# Patient Record
Sex: Female | Born: 1980
Health system: Southern US, Community
[De-identification: ages and names within clinical notes are randomized; demographics above are authoritative.]

## PROBLEM LIST (undated history)

## (undated) ENCOUNTER — Inpatient Hospital Stay (HOSPITAL_COMMUNITY): Payer: Self-pay

## (undated) DIAGNOSIS — Z789 Other specified health status: Secondary | ICD-10-CM

## (undated) HISTORY — PX: NO PAST SURGERIES: SHX2092

---

## 2001-07-30 ENCOUNTER — Other Ambulatory Visit: Admission: RE | Admit: 2001-07-30 | Discharge: 2001-07-30 | Payer: Self-pay | Admitting: Gynecology

## 2002-08-10 ENCOUNTER — Other Ambulatory Visit: Admission: RE | Admit: 2002-08-10 | Discharge: 2002-08-10 | Payer: Self-pay | Admitting: Gynecology

## 2003-08-12 ENCOUNTER — Other Ambulatory Visit: Admission: RE | Admit: 2003-08-12 | Discharge: 2003-08-12 | Payer: Self-pay | Admitting: Gynecology

## 2004-09-27 ENCOUNTER — Other Ambulatory Visit: Admission: RE | Admit: 2004-09-27 | Discharge: 2004-09-27 | Payer: Self-pay | Admitting: Gynecology

## 2005-09-30 ENCOUNTER — Other Ambulatory Visit: Admission: RE | Admit: 2005-09-30 | Discharge: 2005-09-30 | Payer: Self-pay | Admitting: Gynecology

## 2006-10-14 ENCOUNTER — Other Ambulatory Visit: Admission: RE | Admit: 2006-10-14 | Discharge: 2006-10-14 | Payer: Self-pay | Admitting: Gynecology

## 2007-12-09 ENCOUNTER — Other Ambulatory Visit: Admission: RE | Admit: 2007-12-09 | Discharge: 2007-12-09 | Payer: Self-pay | Admitting: Gynecology

## 2009-01-04 ENCOUNTER — Other Ambulatory Visit: Admission: RE | Admit: 2009-01-04 | Discharge: 2009-01-04 | Payer: Self-pay | Admitting: Gynecology

## 2009-01-04 ENCOUNTER — Ambulatory Visit: Payer: Self-pay | Admitting: Women's Health

## 2009-01-04 ENCOUNTER — Encounter: Payer: Self-pay | Admitting: Women's Health

## 2014-04-06 ENCOUNTER — Ambulatory Visit: Payer: Self-pay | Admitting: Women's Health

## 2014-05-05 ENCOUNTER — Ambulatory Visit: Payer: Self-pay | Admitting: Women's Health

## 2014-06-15 ENCOUNTER — Ambulatory Visit (INDEPENDENT_AMBULATORY_CARE_PROVIDER_SITE_OTHER): Payer: Self-pay | Admitting: Women's Health

## 2014-06-15 ENCOUNTER — Encounter: Payer: Self-pay | Admitting: Women's Health

## 2014-06-15 VITALS — BP 122/82 | Ht 68.8 in | Wt 262.6 lb

## 2014-06-15 DIAGNOSIS — N926 Irregular menstruation, unspecified: Secondary | ICD-10-CM

## 2014-06-15 DIAGNOSIS — Z01419 Encounter for gynecological examination (general) (routine) without abnormal findings: Secondary | ICD-10-CM

## 2014-06-15 LAB — CBC WITH DIFFERENTIAL/PLATELET
BASOS PCT: 0 % (ref 0–1)
Basophils Absolute: 0 10*3/uL (ref 0.0–0.1)
EOS ABS: 0.1 10*3/uL (ref 0.0–0.7)
Eosinophils Relative: 2 % (ref 0–5)
HCT: 38.7 % (ref 36.0–46.0)
HEMOGLOBIN: 13.6 g/dL (ref 12.0–15.0)
Lymphocytes Relative: 22 % (ref 12–46)
Lymphs Abs: 1.6 10*3/uL (ref 0.7–4.0)
MCH: 32.1 pg (ref 26.0–34.0)
MCHC: 35.1 g/dL (ref 30.0–36.0)
MCV: 91.3 fL (ref 78.0–100.0)
MONOS PCT: 8 % (ref 3–12)
Monocytes Absolute: 0.6 10*3/uL (ref 0.1–1.0)
NEUTROS PCT: 68 % (ref 43–77)
Neutro Abs: 5 10*3/uL (ref 1.7–7.7)
PLATELETS: 235 10*3/uL (ref 150–400)
RBC: 4.24 MIL/uL (ref 3.87–5.11)
RDW: 13.3 % (ref 11.5–15.5)
WBC: 7.3 10*3/uL (ref 4.0–10.5)

## 2014-06-15 LAB — TSH: TSH: 2.292 u[IU]/mL (ref 0.350–4.500)

## 2014-06-15 NOTE — Progress Notes (Signed)
Carol CooleyMegan S Webb [birth year] [id number]    History:    Presents for annual exam.  Monthly cycle for 6-8 days, off birth control pills for greater than one year without conception. [facility] with normal Paps and negative STD screen. Reports husband as healthy.  Past medical history, past surgical history, family history and social history were all reviewed and documented in the EPIC chart. 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  ROS:  A  12 point ROS was performed and pertinent positives and negatives are included.  Exam:  Filed Vitals:   06/15/14 1058  BP: 122/82    General appearance:  Normal Thyroid:  Symmetrical, normal in size, without palpable masses or nodularity. Respiratory  Auscultation:  Clear without wheezing or rhonchi Cardiovascular  Auscultation:  Regular rate, without rubs, murmurs or gallops  Edema/varicosities:  Not grossly evident Abdominal  Soft,nontender, without masses, guarding or rebound.  Liver/spleen:  No organomegaly noted  Hernia:  None appreciated  Skin  Inspection:  Grossly normal   Breasts: Examined lying and sitting.     Right: Without masses, retractions, discharge or axillary adenopathy.     Left: Without masses, retractions, discharge or axillary adenopathy. Gentitourinary   Inguinal/mons:  Normal without inguinal adenopathy  External genitalia:  Normal  BUS/Urethra/Skene's glands:  Normal  Vagina:  3 cm nontender vaginal cyst, present for many years  Cervix:  Normal  Uterus:   normal in size, shape and contour.  Midline and mobile  Adnexa/parametria:     Rt: Without masses or tenderness.   Lt: Without masses or tenderness.  Anus and perineum: Normal  Digital rectal exam: Normal sphincter tone without palpated masses or tenderness  Assessment/Plan:  33 y.o. MWF G0 for annual exam desiring conception.  No contraception x1 year without pregnancy Morbid obesity Asymptomatic left vaginal cyst  Plan:  Reviewed importance of increasing frequency of intercourse, ovulation predictors reviewed, decreasing weight. CBC, TSH, prolactin. Reviewed checking day 3 of next cycle FSH and estradiol and day 22-25 progesterone level. SBE's, increase exercise and decrease calories for weight loss, calcium rich diet, MVI daily encouraged. Aware we no longer deliver, return to office for viability ultrasound with missed cycle. Safe pregnancy behaviors reviewed.   Note: This dictation was prepared with Dragon/digital dictation.  Any transcriptional errors that result are unintentional. Carol ChallengerYOUNG,Carol Webb J [facility]NP, 1:09 PM 06/15/2014

## 2014-06-15 NOTE — Patient Instructions (Signed)
Pregnancy Tests °HOW DO PREGNANCY TESTS WORK? °All pregnancy tests look for a special hormone in the urine or blood that is only present in pregnant women. This hormone, human chorionic gonadotropin (hCG), is also called the pregnancy hormone.  °WHAT IS THE DIFFERENCE BETWEEN A URINE AND A BLOOD PREGNANCY TEST? IS ONE BETTER THAN THE OTHER? °There are two types of pregnancy tests. °· Blood tests. °· Urine tests. °Both tests look for the presence of hCG, the pregnancy hormone. Many women use a urine test or home pregnancy test (HPT) to find out if they are pregnant. HPTs are cheap, easy to use, can be done at home, and are private. When a woman has a positive result on an HPT, she needs to see her caregiver right away. The caregiver can confirm a positive HPT result with another urine test, a blood test, ultrasound, and a pelvic exam.  °There are two types of blood tests you can get from a caregiver.  °· A quantitative blood test (or the beta hCG test). This test measures the exact amount of hCG in the blood. This means it can pick up very small amounts of hCG, making it a very accurate test. °· A qualitative hCG blood test. This test gives a simple yes or no answer to whether you are pregnant. This test is more like a urine test in terms of its accuracy. °Blood tests can pick up hCG earlier in a pregnancy than urine tests can. Blood tests can tell if you are pregnant about 6 to 8 days after you release an egg from an ovary (ovulate). Urine tests can determine pregnancy about 2 weeks after ovulation.  °HOW IS A HOME PREGNANCY TEST DONE?  °There are many types of home pregnancy tests or HPTs that can be bought over-the-counter at drug or discount stores.  °· Some involve collecting your urine in a cup and dipping a stick into the urine or putting some of the urine into a special container with an eyedropper. °· Others are done by placing a stick into your urine stream. °· Tests vary in how long you need to wait for  the stick or container to turn a certain color or have a symbol on it (like a plus or a minus). °· All tests come with written instructions. Most tests also have toll-free phone numbers to call if you have any questions about how to do the test or read the results. °HOW ACCURATE ARE HOME PREGNANCY TESTS?  °HPTs are very accurate. Most brands of HPTs say they are 97% to 99% accurate when taken 1 week after missing your menstrual period, but this can vary with actual use. Each brand varies in how sensitive it is in picking up the pregnancy hormone hCG. If a test is not done correctly, it will be less accurate. Always check the package to make sure it is not past its expiration date. If it is, it will not be accurate. Most brands of HPTs tell users to do the test again in a few days, no matter what the results.  °If you use an HPT too early in your pregnancy, you may not have enough of the pregnancy hormone hCG in your urine to have a positive test result. Most HPTs will be accurate if you test yourself around the time your period is due (about 2 weeks after you ovulate). You can get a negative test result if you are not pregnant or if you ovulated later than you thought you did.   You may also have problems with the pregnancy, which affects the amount of hCG you have in your urine. If your HPT is negative, test yourself again within a few days to 1 week. If you keep getting a negative result and think you are pregnant, talk with your caregiver right away about getting a blood pregnancy test.  °FALSE POSITIVE PREGNANCY TEST °A false positive HPT can happen if there is blood or protein present in your urine. A false positive can also happen if you were recently pregnant or if you take a pregnancy test too soon after taking fertility drug that contains hCG. Also, some prescription medicines such as water pills (diuretics), tranquilizers, seizure medicines, psychiatric medicines, and allergy and nausea medicines  (promethazine) give false positive readings. °FALSE NEGATIVE PREGNANCY TEST °· A false negative HPT can happen if you do the test too early. Try to wait until you are at least 1 day late for your menstrual period. °· It may happen if you wait too long to test the urine (longer than 15 minutes). °· It may also happen if the urine is too diluted because you drank a lot of fluids before getting the urine sample. It is best to test the first morning urine after you get out of bed. °If your menstrual period did not start after a week of a negative HPT, repeat the pregnancy test. °CAN ANYTHING INTERFERE WITH HOME PREGNANCY TEST RESULTS?  °Most medicines, both over-the-counter and prescription drugs, including birth control pills and antibiotics, should not affect the results of a HPT. Only those drugs that have the pregnancy hormone hCG in them can give a false positive test result. Drugs that have hCG in them may be used for treating infertility (not being able to get pregnant). Alcohol and illegal drugs do not affect HPT results, but you should not be using these substances if you are trying to get pregnant. If you have a positive pregnancy test, call your caregiver to make an appointment to begin prenatal care. °Document Released: 10/17/2003 Document Revised: 01/06/2012 Document Reviewed: 01/28/2014 °ExitCare® Patient Information ©2015 [employer]. This information is not intended to replace advice given to you by your health care provider. Make sure you discuss any questions you have with your health care provider. ° °

## 2014-06-16 LAB — PROLACTIN: Prolactin: 13.5 ng/mL

## 2014-06-29 ENCOUNTER — Telehealth: Payer: Self-pay | Admitting: *Deleted

## 2014-06-29 NOTE — Telephone Encounter (Signed)
Pt left message in voicemail asking which day to have blood work done. I left message on pt voicemail per note "day 3 of next cycle FSH and estradiol and day 22-25 progesterone level.

## 2014-07-01 ENCOUNTER — Other Ambulatory Visit: Payer: Self-pay | Admitting: Women's Health

## 2014-07-01 ENCOUNTER — Other Ambulatory Visit: Payer: Self-pay

## 2014-07-01 DIAGNOSIS — N926 Irregular menstruation, unspecified: Secondary | ICD-10-CM

## 2014-07-02 LAB — FOLLICLE STIMULATING HORMONE: FSH: 6.3 m[IU]/mL

## 2014-07-05 LAB — ESTRADIOL, FREE

## 2014-07-06 LAB — ESTRADIOL: Estradiol: 25.5 pg/mL

## 2014-07-25 ENCOUNTER — Other Ambulatory Visit: Payer: Self-pay

## 2014-07-25 DIAGNOSIS — N926 Irregular menstruation, unspecified: Secondary | ICD-10-CM

## 2014-07-26 LAB — PROGESTERONE: PROGESTERONE: 2.2 ng/mL

## 2014-07-29 ENCOUNTER — Telehealth: Payer: Self-pay | Admitting: *Deleted

## 2014-07-29 NOTE — Telephone Encounter (Signed)
Notes faxed to Dr.Yalcinkaya office they will contact pt to schedule. 

## 2014-07-29 NOTE — Telephone Encounter (Signed)
Message copied by Aura CampsWEBB, Bradrick Kamau L on Fri Jul 29, 2014  2:16 PM ------      Message from: Keenan BachelorANNAS, KATHERINE R      Created: Fri Jul 29, 2014  1:01 PM      Regarding: Referral to April MansonYalcinkaya       Per Yolanda BonineNancy Y. "Please call and review progesterone low end, estradiol also low which is probably contributing to why not pregnant, may not be ovulating.  Also should check semen analysis and will need to be referred to fertility specialist, Dr April MansonYalcinkaya. "            I informed patient of above and that you will send referral form to them and they will contact her to schedule and arrange semen analysis.      Thanks! ------

## 2017-03-12 ENCOUNTER — Encounter: Payer: Self-pay | Admitting: Gynecology

## 2017-08-25 DIAGNOSIS — Z3149 Encounter for other procreative investigation and testing: Secondary | ICD-10-CM | POA: Diagnosis not present

## 2017-09-08 DIAGNOSIS — Z3A01 Less than 8 weeks gestation of pregnancy: Secondary | ICD-10-CM | POA: Diagnosis not present

## 2017-09-08 DIAGNOSIS — O0901 Supervision of pregnancy with history of infertility, first trimester: Secondary | ICD-10-CM | POA: Diagnosis not present

## 2017-09-08 DIAGNOSIS — Z3201 Encounter for pregnancy test, result positive: Secondary | ICD-10-CM | POA: Diagnosis not present

## 2017-09-25 DIAGNOSIS — Z23 Encounter for immunization: Secondary | ICD-10-CM | POA: Diagnosis not present

## 2017-09-25 DIAGNOSIS — Z3201 Encounter for pregnancy test, result positive: Secondary | ICD-10-CM | POA: Diagnosis not present

## 2017-10-16 DIAGNOSIS — O09511 Supervision of elderly primigravida, first trimester: Secondary | ICD-10-CM | POA: Diagnosis not present

## 2017-10-16 DIAGNOSIS — Z3689 Encounter for other specified antenatal screening: Secondary | ICD-10-CM | POA: Diagnosis not present

## 2017-10-16 DIAGNOSIS — Z118 Encounter for screening for other infectious and parasitic diseases: Secondary | ICD-10-CM | POA: Diagnosis not present

## 2017-10-16 DIAGNOSIS — O021 Missed abortion: Secondary | ICD-10-CM | POA: Diagnosis not present

## 2017-10-27 DIAGNOSIS — O034 Incomplete spontaneous abortion without complication: Secondary | ICD-10-CM | POA: Diagnosis not present

## 2017-11-04 DIAGNOSIS — O021 Missed abortion: Secondary | ICD-10-CM | POA: Diagnosis not present

## 2018-01-30 DIAGNOSIS — Z1151 Encounter for screening for human papillomavirus (HPV): Secondary | ICD-10-CM | POA: Diagnosis not present

## 2018-01-30 DIAGNOSIS — Z6838 Body mass index (BMI) 38.0-38.9, adult: Secondary | ICD-10-CM | POA: Diagnosis not present

## 2018-01-30 DIAGNOSIS — Z3149 Encounter for other procreative investigation and testing: Secondary | ICD-10-CM | POA: Diagnosis not present

## 2018-01-30 DIAGNOSIS — E669 Obesity, unspecified: Secondary | ICD-10-CM | POA: Diagnosis not present

## 2018-01-30 DIAGNOSIS — N72 Inflammatory disease of cervix uteri: Secondary | ICD-10-CM | POA: Diagnosis not present

## 2018-01-30 DIAGNOSIS — B977 Papillomavirus as the cause of diseases classified elsewhere: Secondary | ICD-10-CM | POA: Diagnosis not present

## 2018-01-30 DIAGNOSIS — Z01419 Encounter for gynecological examination (general) (routine) without abnormal findings: Secondary | ICD-10-CM | POA: Diagnosis not present

## 2018-03-02 DIAGNOSIS — Z6836 Body mass index (BMI) 36.0-36.9, adult: Secondary | ICD-10-CM | POA: Diagnosis not present

## 2018-03-02 DIAGNOSIS — Z713 Dietary counseling and surveillance: Secondary | ICD-10-CM | POA: Diagnosis not present

## 2018-03-25 DIAGNOSIS — O09291 Supervision of pregnancy with other poor reproductive or obstetric history, first trimester: Secondary | ICD-10-CM | POA: Diagnosis not present

## 2018-03-25 DIAGNOSIS — Z3201 Encounter for pregnancy test, result positive: Secondary | ICD-10-CM | POA: Diagnosis not present

## 2018-03-25 DIAGNOSIS — Z3A01 Less than 8 weeks gestation of pregnancy: Secondary | ICD-10-CM | POA: Diagnosis not present

## 2018-03-27 DIAGNOSIS — O09291 Supervision of pregnancy with other poor reproductive or obstetric history, first trimester: Secondary | ICD-10-CM | POA: Diagnosis not present

## 2018-03-27 DIAGNOSIS — Z3A01 Less than 8 weeks gestation of pregnancy: Secondary | ICD-10-CM | POA: Diagnosis not present

## 2018-03-29 ENCOUNTER — Encounter (HOSPITAL_COMMUNITY): Payer: Self-pay | Admitting: *Deleted

## 2018-03-29 ENCOUNTER — Emergency Department (HOSPITAL_COMMUNITY): Payer: BLUE CROSS/BLUE SHIELD

## 2018-03-29 ENCOUNTER — Other Ambulatory Visit: Payer: Self-pay

## 2018-03-29 ENCOUNTER — Observation Stay (HOSPITAL_COMMUNITY)
Admission: EM | Admit: 2018-03-29 | Discharge: 2018-03-31 | DRG: 742 | Disposition: A | Discharge status: Home / Self Care | Payer: BLUE CROSS/BLUE SHIELD | Attending: Obstetrics and Gynecology | Admitting: Obstetrics and Gynecology

## 2018-03-29 DIAGNOSIS — N83201 Unspecified ovarian cyst, right side: Secondary | ICD-10-CM | POA: Diagnosis not present

## 2018-03-29 DIAGNOSIS — Z3A01 Less than 8 weeks gestation of pregnancy: Secondary | ICD-10-CM | POA: Diagnosis not present

## 2018-03-29 DIAGNOSIS — R188 Other ascites: Secondary | ICD-10-CM | POA: Diagnosis not present

## 2018-03-29 DIAGNOSIS — R109 Unspecified abdominal pain: Secondary | ICD-10-CM | POA: Diagnosis not present

## 2018-03-29 DIAGNOSIS — K661 Hemoperitoneum: Secondary | ICD-10-CM | POA: Insufficient documentation

## 2018-03-29 DIAGNOSIS — D649 Anemia, unspecified: Secondary | ICD-10-CM | POA: Insufficient documentation

## 2018-03-29 DIAGNOSIS — O3480 Maternal care for other abnormalities of pelvic organs, unspecified trimester: Secondary | ICD-10-CM | POA: Diagnosis not present

## 2018-03-29 DIAGNOSIS — Z87891 Personal history of nicotine dependence: Secondary | ICD-10-CM | POA: Diagnosis not present

## 2018-03-29 DIAGNOSIS — O26891 Other specified pregnancy related conditions, first trimester: Secondary | ICD-10-CM

## 2018-03-29 DIAGNOSIS — R1010 Upper abdominal pain, unspecified: Secondary | ICD-10-CM | POA: Diagnosis not present

## 2018-03-29 DIAGNOSIS — O26899 Other specified pregnancy related conditions, unspecified trimester: Secondary | ICD-10-CM | POA: Diagnosis not present

## 2018-03-29 DIAGNOSIS — O009 Unspecified ectopic pregnancy without intrauterine pregnancy: Secondary | ICD-10-CM

## 2018-03-29 DIAGNOSIS — Z3A00 Weeks of gestation of pregnancy not specified: Secondary | ICD-10-CM | POA: Diagnosis not present

## 2018-03-29 DIAGNOSIS — O283 Abnormal ultrasonic finding on antenatal screening of mother: Secondary | ICD-10-CM | POA: Diagnosis not present

## 2018-03-29 DIAGNOSIS — Z6839 Body mass index (BMI) 39.0-39.9, adult: Secondary | ICD-10-CM | POA: Diagnosis not present

## 2018-03-29 DIAGNOSIS — R1031 Right lower quadrant pain: Secondary | ICD-10-CM

## 2018-03-29 DIAGNOSIS — R102 Pelvic and perineal pain: Secondary | ICD-10-CM | POA: Diagnosis not present

## 2018-03-29 DIAGNOSIS — O3680X0 Pregnancy with inconclusive fetal viability, not applicable or unspecified: Secondary | ICD-10-CM

## 2018-03-29 DIAGNOSIS — Z6835 Body mass index (BMI) 35.0-35.9, adult: Secondary | ICD-10-CM | POA: Diagnosis not present

## 2018-03-29 DIAGNOSIS — R1030 Lower abdominal pain, unspecified: Secondary | ICD-10-CM | POA: Diagnosis not present

## 2018-03-29 HISTORY — DX: Other specified health status: Z78.9

## 2018-03-29 LAB — CBC
HCT: 37 % (ref 36.0–46.0)
Hemoglobin: 12.3 g/dL (ref 12.0–15.0)
MCH: 31.8 pg (ref 26.0–34.0)
MCHC: 33.2 g/dL (ref 30.0–36.0)
MCV: 95.6 fL (ref 78.0–100.0)
PLATELETS: 324 10*3/uL (ref 150–400)
RBC: 3.87 MIL/uL (ref 3.87–5.11)
RDW: 13.1 % (ref 11.5–15.5)
WBC: 20.9 10*3/uL — AB (ref 4.0–10.5)

## 2018-03-29 LAB — WET PREP, GENITAL
Sperm: NONE SEEN
Trich, Wet Prep: NONE SEEN
Yeast Wet Prep HPF POC: NONE SEEN

## 2018-03-29 LAB — SAMPLE TO BLOOD BANK

## 2018-03-29 LAB — COMPREHENSIVE METABOLIC PANEL
ALT: 19 U/L (ref 14–54)
AST: 21 U/L (ref 15–41)
Albumin: 4.1 g/dL (ref 3.5–5.0)
Alkaline Phosphatase: 49 U/L (ref 38–126)
Anion gap: 12 (ref 5–15)
BILIRUBIN TOTAL: 1.2 mg/dL (ref 0.3–1.2)
BUN: 9 mg/dL (ref 6–20)
CHLORIDE: 100 mmol/L — AB (ref 101–111)
CO2: 18 mmol/L — ABNORMAL LOW (ref 22–32)
CREATININE: 0.68 mg/dL (ref 0.44–1.00)
Calcium: 9 mg/dL (ref 8.9–10.3)
GFR calc non Af Amer: >60 mL/min (ref >60)
Glucose, Bld: 155 mg/dL — ABNORMAL HIGH (ref 65–99)
Potassium: 3.3 mmol/L — ABNORMAL LOW (ref 3.5–5.1)
Sodium: 130 mmol/L — ABNORMAL LOW (ref 135–145)
TOTAL PROTEIN: 7.1 g/dL (ref 6.5–8.1)

## 2018-03-29 LAB — I-STAT CHEM 8, ED
BUN: 10 mg/dL (ref 6–20)
CHLORIDE: 100 mmol/L — AB (ref 101–111)
CREATININE: 0.5 mg/dL (ref 0.44–1.00)
Calcium, Ion: 1.18 mmol/L (ref 1.15–1.40)
Glucose, Bld: 126 mg/dL — ABNORMAL HIGH (ref 65–99)
HEMATOCRIT: 30 % — AB (ref 36.0–46.0)
Hemoglobin: 10.2 g/dL — ABNORMAL LOW (ref 12.0–15.0)
POTASSIUM: 4 mmol/L (ref 3.5–5.1)
SODIUM: 135 mmol/L (ref 135–145)
TCO2: 20 mmol/L — ABNORMAL LOW (ref 22–32)

## 2018-03-29 LAB — I-STAT BETA HCG BLOOD, ED (MC, WL, AP ONLY): HCG, QUANTITATIVE: 1662.4 m[IU]/mL — AB (ref <5)

## 2018-03-29 LAB — LIPASE, BLOOD: LIPASE: 25 U/L (ref 11–51)

## 2018-03-29 MED ORDER — SODIUM CHLORIDE 0.9 % IV BOLUS
1000.0000 mL | Freq: Once | INTRAVENOUS | Status: AC
  Administered 2018-03-29: 1000 mL via INTRAVENOUS

## 2018-03-29 NOTE — ED Provider Notes (Signed)
[facility] EMERGENCY DEPARTMENT Provider Note   CSN: [id number] Arrival date & time: 03/29/18  1825     History   Chief Complaint Chief Complaint  Patient presents with  . rectal pain    HPI Carol Webb is a 37 y.o. female.  HPI Carol Webb is a 37 y.o. female Presents to ED with complaint of rectal pain, abdominal pain, nausea, vomiting, dizziness. Pt's symptoms began... Patient states she is about [redacted] weeks pregnant.  This is her second pregnancy, states the first 1 resulted in miscarriage in December.  She was seen by OB/GYN last week, states was there for hCG check, she came back 2 days later for recheck and states her levels were elevated normally at that time.  Patient did not have ultrasound.  She states that currently having pain in the upper abdomen, mainly right upper quadrant radiating to the shoulders.  She reports associated nausea.  She also reports pressure in the rectum.  States she may be constipated.  She is unsure when her last normal bowel movement was, but states she had a small bowel movement here in the waiting room.  She denies any rectal pain.  There is no rectal bleeding.  No vomiting.  No fever or chills.  No urinary symptoms.  Denies any vaginal discharge, denies any vaginal bleeding.  History reviewed. No pertinent past medical history.  There are no active problems to display for this patient.   History reviewed. No pertinent surgical history.   OB History    Gravida  1   Para      Term      Preterm      AB      Living        SAB      TAB      Ectopic      Multiple      Live Births               Home Medications    Prior to Admission medications   Not on File    Family History Family History  Problem Relation Age of Onset  . Colon cancer Father   . Prostate cancer Father     Social History Social History   Tobacco Use  . Smoking status: Former Smoker    Types: Cigarettes  . Smokeless  tobacco: Never Used  Substance Use Topics  . Alcohol use: Yes  . Drug use: Yes    Types: Marijuana     Allergies   Patient has no known allergies.   Review of Systems Review of Systems  Constitutional: Negative for chills and fever.  Respiratory: Negative for cough, chest tightness and shortness of breath.   Cardiovascular: Negative for chest pain, palpitations and leg swelling.  Gastrointestinal: Positive for abdominal pain and nausea. Negative for blood in stool, diarrhea, rectal pain and vomiting.  Genitourinary: Negative for dysuria, flank pain, pelvic pain, vaginal bleeding, vaginal discharge and vaginal pain.  Musculoskeletal: Negative for arthralgias, myalgias, neck pain and neck stiffness.  Skin: Negative for rash.  Neurological: Positive for dizziness and light-headedness. Negative for weakness and headaches.  All other systems reviewed and are negative.    Physical Exam Updated Vital Signs BP 119/87 (BP Location: Right Arm)   Pulse (!) 114   Temp 97.6 F (36.4 C) (Oral)   Resp 17   Ht 5\' 7"  (1.702 m)   Wt 103 kg (227 lb)   LMP 02/20/2018  SpO2 100%   BMI 35.55 kg/m   Physical Exam  Constitutional: She appears well-developed and well-nourished. No distress.  HENT:  Head: Normocephalic.  Eyes: Conjunctivae are normal.  Neck: Neck supple.  Cardiovascular: Normal rate, regular rhythm and normal heart sounds.  Pulmonary/Chest: Effort normal and breath sounds normal. No respiratory distress. She has no wheezes. She has no rales.  Abdominal: Soft. Bowel sounds are normal. She exhibits no distension. There is tenderness. There is no rebound.  Right upper quadrant tenderness  Genitourinary:  Genitourinary Comments: Rectum is normal, no hemorrhoids, no fissures.  Digital exam unremarkable, no stool, no pain or tenderness.  Stool is brown.  External vaginal genitalia normal.  Small white thin vaginal discharge.  There is a cystic structure to the left wall of the  vaginal canal (chronic), there is no cervical motion tenderness, no uterine tenderness.  There is no right adnexal tenderness.  There is minimal left adnexal tenderness.  Cervix is normal, closed.  Musculoskeletal: She exhibits no edema.  Neurological: She is alert.  Skin: Skin is warm and dry.  Psychiatric: She has a normal mood and affect. Her behavior is normal.  Nursing note and vitals reviewed.    ED Treatments / Results  Labs (all labs ordered are listed, but only abnormal results are displayed) Labs Reviewed  WET PREP, GENITAL - Abnormal; Notable for the following components:      Result Value   Clue Cells Wet Prep HPF POC PRESENT (*)    WBC, Wet Prep HPF POC MANY (*)    All other components within normal limits  COMPREHENSIVE METABOLIC PANEL - Abnormal; Notable for the following components:   Sodium 130 (*)    Potassium 3.3 (*)    Chloride 100 (*)    CO2 18 (*)    Glucose, Bld 155 (*)    All other components within normal limits  CBC - Abnormal; Notable for the following components:   WBC 20.9 (*)    All other components within normal limits  I-STAT BETA HCG BLOOD, ED ([facility], [facility], [facility] ONLY) - Abnormal; Notable for the following components:   I-stat hCG, quantitative 1,662.4 (*)    All other components within normal limits  LIPASE, BLOOD  URINALYSIS, ROUTINE W REFLEX MICROSCOPIC  SAMPLE TO BLOOD BANK  GC/CHLAMYDIA PROBE AMP ([place]) NOT AT [facility]    EKG None  Radiology No results found.  Procedures Procedures (including critical care time)  Medications Ordered in ED Medications - No data to display   Initial Impression / Assessment and Plan / ED Course  I have reviewed the triage vital signs and the nursing notes.  Pertinent labs & imaging results that were available during my care of the patient were reviewed by me and considered in my medical decision making (see chart for details).     Patient approximately [redacted] weeks pregnant, presents with rectal  pressure, abdominal pain, nausea, dizziness.  Rectal exam is unremarkable, no stool in rectum.  Patient does have right upper quadrant abdominal pain.  No significant suprapubic tenderness or adnexal tenderness.  Will check labs, will get ultrasound for further evaluation  9:56 PM Patient does not want anything for pain.  She continues to experience severe pains in the upper abdomen that come and go and radiate around upper abdomen and into the bilateral shoulders.  She is not having any cough or shortness of breath with the symptoms.  Her oxygen saturations 100%.  I suspect this could be related to her  gallbladder disease.  Ultrasound is pending.  Ultrasound pelvis pending as well.  Signed out at shift change pending results.  Final Clinical Impressions(s) / ED Diagnoses   Final diagnoses:  Abdominal pain    ED Discharge Orders    None       Iona CoachKirichenko, Letetia Romanello, PA-C 03/29/18 2156    Gwyneth SproutPlunkett, Whitney, MD 03/29/18 2158

## 2018-03-29 NOTE — ED Notes (Signed)
Patient transported to Ultrasound 

## 2018-03-29 NOTE — ED Notes (Signed)
Prior RN unable to gain IV access, this RN attempted, unsuccessful.  PA advised and instructed to not try anymore, might not need it. Hold on bolus.

## 2018-03-29 NOTE — ED Notes (Signed)
The pt had a miscarriage in December  She talked to her ob-gyn on her phone in triage

## 2018-03-29 NOTE — ED Provider Notes (Signed)
Patient was being evaluated for possible gallbladder pathology and had ultrasound performed which showed free fluid in the abdomen. Patient is [redacted] weeks pregnant. Ultrasound was ordered by physician assistan tfor obstetric details concerning for ectopic pregnancy. At this point we are concern for ruptured ectopic, type and screen obtained, difficult IV placed ultrasound-guided by myself. Repeat hemoglobin ordered initial hemoglobin normal range. Patient having persistent pain. Discussed plan for OR, discussed with obstetric specialist and if hemoglobin stable plan to transfer if dropping significantly [facility]. Patient's heart rate 110, blood pressure stable, hemoglobin dropped 2 points over 4 hours.Patient transferred emergently to women's.   Labs Reviewed  WET PREP, GENITAL - Abnormal; Notable for the following components:      Result Value   Clue Cells Wet Prep HPF POC PRESENT (*)    WBC, Wet Prep HPF POC MANY (*)    All other components within normal limits  COMPREHENSIVE METABOLIC PANEL - Abnormal; Notable for the following components:   Sodium 130 (*)    Potassium 3.3 (*)    Chloride 100 (*)    CO2 18 (*)    Glucose, Bld 155 (*)    All other components within normal limits  CBC - Abnormal; Notable for the following components:   WBC 20.9 (*)    All other components within normal limits  I-STAT BETA HCG BLOOD, ED ([facility], [facility], [facility] ONLY) - Abnormal; Notable for the following components:   I-stat hCG, quantitative 1,662.4 (*)    All other components within normal limits  I-STAT CHEM 8, ED - Abnormal; Notable for the following components:   Chloride 100 (*)    Glucose, Bld 126 (*)    TCO2 20 (*)    Hemoglobin 10.2 (*)    HCT 30.0 (*)    All other components within normal limits  LIPASE, BLOOD  URINALYSIS, ROUTINE W REFLEX MICROSCOPIC  CBC  SAMPLE TO BLOOD BANK  TYPE AND SCREEN  ABO/RH  GC/CHLAMYDIA PROBE AMP ([place]) NOT AT [facility]    CRITICAL  CARE Performed by: Carol Webb  Total critical care time: 40 minutes  Critical care time was exclusive of separately billable procedures and treating other patients.  Critical care was necessary to treat or prevent imminent or life-threatening deterioration.  Critical care was time spent personally by me on the following activities: development of treatment plan with patient and/or surrogate as well as nursing, discussions with consultants, evaluation of patient's response to treatment, examination of patient, obtaining history from patient or surrogate, ordering and performing treatments and interventions, ordering and review of laboratory studies, ordering and review of radiographic studies, pulse oximetry and re-evaluation of patient's condition.  Emergency Ultrasound Study:   Angiocath insertion Performed by: Carol Webb  Consent: Verbal consent obtained. Risks and benefits: risks, benefits and alternatives were discussed Immediately prior to procedure the correct patient, procedure, equipment, support staff and site/side marked as needed.  Indication: difficult IV access Preparation: Patient was prepped and draped in the usual sterile fashion. Vein Location: right basilic vein was visualized during assessment for potential access sites and was found to be patent/ easily compressed with linear ultrasound.  The needle was visualized with real-time ultrasound and guided into the vein. Gauge: 18 g  Image saved and stored.  Normal blood return.  Patient tolerance: Patient tolerated the procedure well with no immediate complications.       Carol Webb, Carol Cazier, MD 03/29/18 [phone]

## 2018-03-29 NOTE — ED Notes (Signed)
carelink at bedside 

## 2018-03-29 NOTE — ED Triage Notes (Signed)
Pt c/o rectal pressure and pain  All day  Last bm yesterday that was normal.   Nausea  lmp  April 26th

## 2018-03-30 ENCOUNTER — Inpatient Hospital Stay (HOSPITAL_COMMUNITY): Payer: BLUE CROSS/BLUE SHIELD | Admitting: Certified Registered Nurse Anesthetist

## 2018-03-30 ENCOUNTER — Other Ambulatory Visit: Payer: Self-pay

## 2018-03-30 ENCOUNTER — Encounter (HOSPITAL_COMMUNITY)
Admission: EM | Disposition: A | Discharge status: Home / Self Care | Payer: Self-pay | Source: Home / Self Care | Attending: Emergency Medicine

## 2018-03-30 ENCOUNTER — Encounter (HOSPITAL_COMMUNITY): Payer: Self-pay | Admitting: *Deleted

## 2018-03-30 ENCOUNTER — Inpatient Hospital Stay (HOSPITAL_COMMUNITY): Payer: BLUE CROSS/BLUE SHIELD

## 2018-03-30 DIAGNOSIS — R1030 Lower abdominal pain, unspecified: Secondary | ICD-10-CM | POA: Diagnosis not present

## 2018-03-30 DIAGNOSIS — Z3A00 Weeks of gestation of pregnancy not specified: Secondary | ICD-10-CM | POA: Diagnosis not present

## 2018-03-30 DIAGNOSIS — K659 Peritonitis, unspecified: Secondary | ICD-10-CM | POA: Diagnosis not present

## 2018-03-30 DIAGNOSIS — O283 Abnormal ultrasonic finding on antenatal screening of mother: Secondary | ICD-10-CM | POA: Diagnosis not present

## 2018-03-30 DIAGNOSIS — N83291 Other ovarian cyst, right side: Secondary | ICD-10-CM | POA: Diagnosis not present

## 2018-03-30 DIAGNOSIS — N838 Other noninflammatory disorders of ovary, fallopian tube and broad ligament: Secondary | ICD-10-CM | POA: Diagnosis not present

## 2018-03-30 DIAGNOSIS — O26891 Other specified pregnancy related conditions, first trimester: Secondary | ICD-10-CM | POA: Diagnosis present

## 2018-03-30 DIAGNOSIS — O3480 Maternal care for other abnormalities of pelvic organs, unspecified trimester: Secondary | ICD-10-CM | POA: Diagnosis not present

## 2018-03-30 DIAGNOSIS — N83201 Unspecified ovarian cyst, right side: Secondary | ICD-10-CM | POA: Diagnosis not present

## 2018-03-30 DIAGNOSIS — K661 Hemoperitoneum: Secondary | ICD-10-CM | POA: Diagnosis present

## 2018-03-30 DIAGNOSIS — R109 Unspecified abdominal pain: Secondary | ICD-10-CM

## 2018-03-30 HISTORY — PX: DIAGNOSTIC LAPAROSCOPY WITH REMOVAL OF ECTOPIC PREGNANCY: SHX6449

## 2018-03-30 LAB — POCT I-STAT EG7
ACID-BASE DEFICIT: 3 mmol/L — AB (ref 0.0–2.0)
Bicarbonate: 22.8 mmol/L (ref 20.0–28.0)
CALCIUM ION: 1.19 mmol/L (ref 1.15–1.40)
HEMATOCRIT: 26 % — AB (ref 36.0–46.0)
HEMOGLOBIN: 8.8 g/dL — AB (ref 12.0–15.0)
O2 SAT: 69 %
PH VEN: 7.308 (ref 7.250–7.430)
POTASSIUM: 3.8 mmol/L (ref 3.5–5.1)
Sodium: 138 mmol/L (ref 135–145)
TCO2: 24 mmol/L (ref 22–32)
pCO2, Ven: 45.5 mmHg (ref 44.0–60.0)
pO2, Ven: 40 mmHg (ref 32.0–45.0)

## 2018-03-30 LAB — CBC
HCT: 25.2 % — ABNORMAL LOW (ref 36.0–46.0)
HEMATOCRIT: 26.5 % — AB (ref 36.0–46.0)
HEMOGLOBIN: 8.9 g/dL — AB (ref 12.0–15.0)
Hemoglobin: 8.8 g/dL — ABNORMAL LOW (ref 12.0–15.0)
MCH: 32.8 pg (ref 26.0–34.0)
MCH: 32.1 pg (ref 26.0–34.0)
MCHC: 34.9 g/dL (ref 30.0–36.0)
MCHC: 33.6 g/dL (ref 30.0–36.0)
MCV: 94 fL (ref 78.0–100.0)
MCV: 95.7 fL (ref 78.0–100.0)
Platelets: 208 10*3/uL (ref 150–400)
Platelets: 237 10*3/uL (ref 150–400)
RBC: 2.68 MIL/uL — ABNORMAL LOW (ref 3.87–5.11)
RBC: 2.77 MIL/uL — AB (ref 3.87–5.11)
RDW: 13.5 % (ref 11.5–15.5)
RDW: 13.5 % (ref 11.5–15.5)
WBC: 11 10*3/uL — ABNORMAL HIGH (ref 4.0–10.5)
WBC: 12.2 10*3/uL — ABNORMAL HIGH (ref 4.0–10.5)

## 2018-03-30 LAB — GC/CHLAMYDIA PROBE AMP (~~LOC~~) NOT AT ARMC
CHLAMYDIA, DNA PROBE: NEGATIVE
NEISSERIA GONORRHEA: NEGATIVE

## 2018-03-30 LAB — PREPARE RBC (CROSSMATCH)

## 2018-03-30 LAB — ABO/RH: ABO/RH(D): O POS

## 2018-03-30 LAB — HCG, QUANTITATIVE, PREGNANCY: hCG, Beta Chain, Quant, S: 1702 m[IU]/mL — ABNORMAL HIGH (ref <5)

## 2018-03-30 SURGERY — LAPAROSCOPY, WITH ECTOPIC PREGNANCY SURGICAL TREATMENT
Anesthesia: General

## 2018-03-30 MED ORDER — LIDOCAINE HCL (CARDIAC) PF 100 MG/5ML IV SOSY
PREFILLED_SYRINGE | INTRAVENOUS | Status: DC | PRN
  Administered 2018-03-30: 100 mg via INTRAVENOUS

## 2018-03-30 MED ORDER — SCOPOLAMINE 1 MG/3DAYS TD PT72
MEDICATED_PATCH | TRANSDERMAL | Status: DC | PRN
  Administered 2018-03-30: 1 via TRANSDERMAL

## 2018-03-30 MED ORDER — HYDROMORPHONE HCL 1 MG/ML IJ SOLN
1.0000 mg | Freq: Once | INTRAMUSCULAR | Status: AC
  Administered 2018-03-30: 1 mg via INTRAVENOUS
  Filled 2018-03-30: qty 1

## 2018-03-30 MED ORDER — DEXAMETHASONE SODIUM PHOSPHATE 10 MG/ML IJ SOLN
INTRAMUSCULAR | Status: DC | PRN
  Administered 2018-03-30: 10 mg via INTRAVENOUS

## 2018-03-30 MED ORDER — LIDOCAINE HCL (CARDIAC) PF 100 MG/5ML IV SOSY
PREFILLED_SYRINGE | INTRAVENOUS | Status: AC
  Filled 2018-03-30: qty 5

## 2018-03-30 MED ORDER — MIDAZOLAM HCL 2 MG/2ML IJ SOLN
INTRAMUSCULAR | Status: AC
  Filled 2018-03-30: qty 2

## 2018-03-30 MED ORDER — ACETAMINOPHEN 325 MG PO TABS: 650.0000 mg | ORAL_TABLET | ORAL | Status: DC | PRN

## 2018-03-30 MED ORDER — FENTANYL CITRATE (PF) 100 MCG/2ML IJ SOLN: 100.0000 ug | INTRAMUSCULAR | Status: DC | PRN

## 2018-03-30 MED ORDER — OXYCODONE-ACETAMINOPHEN 5-325 MG PO TABS
1.0000 | ORAL_TABLET | ORAL | Status: DC | PRN
  Administered 2018-03-30: 1 via ORAL
  Filled 2018-03-30: qty 1

## 2018-03-30 MED ORDER — PROMETHAZINE HCL 25 MG/ML IJ SOLN
12.5000 mg | Freq: Once | INTRAMUSCULAR | Status: AC
  Administered 2018-03-30: 12.5 mg via INTRAVENOUS
  Filled 2018-03-30: qty 1

## 2018-03-30 MED ORDER — SODIUM CHLORIDE 0.9 % IJ SOLN
INTRAMUSCULAR | Status: DC | PRN
  Administered 2018-03-30: 10 mL via INTRAVENOUS

## 2018-03-30 MED ORDER — SUCCINYLCHOLINE CHLORIDE 20 MG/ML IJ SOLN
INTRAMUSCULAR | Status: DC | PRN
  Administered 2018-03-30: 100 mg via INTRAVENOUS

## 2018-03-30 MED ORDER — DOCUSATE SODIUM 100 MG PO CAPS: 100.0000 mg | ORAL_CAPSULE | Freq: Every day | ORAL | Status: DC

## 2018-03-30 MED ORDER — MIDAZOLAM HCL 2 MG/2ML IJ SOLN
INTRAMUSCULAR | Status: DC | PRN
  Administered 2018-03-30: 2 mg via INTRAVENOUS

## 2018-03-30 MED ORDER — GLYCOPYRROLATE 0.2 MG/ML IJ SOLN
INTRAMUSCULAR | Status: AC
  Filled 2018-03-30: qty 1

## 2018-03-30 MED ORDER — FENTANYL CITRATE (PF) 100 MCG/2ML IJ SOLN: 25.0000 ug | INTRAMUSCULAR | Status: DC | PRN

## 2018-03-30 MED ORDER — ONDANSETRON HCL 4 MG/2ML IJ SOLN
INTRAMUSCULAR | Status: AC
  Filled 2018-03-30: qty 2

## 2018-03-30 MED ORDER — BUPIVACAINE HCL (PF) 0.25 % IJ SOLN
INTRAMUSCULAR | Status: DC | PRN
  Administered 2018-03-30: 30 mL

## 2018-03-30 MED ORDER — CEFAZOLIN SODIUM-DEXTROSE 2-3 GM-%(50ML) IV SOLR
INTRAVENOUS | Status: DC | PRN
  Administered 2018-03-30: 2 g via INTRAVENOUS

## 2018-03-30 MED ORDER — PROMETHAZINE HCL 25 MG/ML IJ SOLN: 6.2500 mg | INTRAMUSCULAR | Status: DC | PRN

## 2018-03-30 MED ORDER — SUGAMMADEX SODIUM 500 MG/5ML IV SOLN
INTRAVENOUS | Status: AC
  Filled 2018-03-30: qty 5

## 2018-03-30 MED ORDER — DEXTROSE IN LACTATED RINGERS 5 % IV SOLN
INTRAVENOUS | Status: DC
  Administered 2018-03-30: 14:00:00 via INTRAVENOUS

## 2018-03-30 MED ORDER — ROCURONIUM BROMIDE 100 MG/10ML IV SOLN
INTRAVENOUS | Status: AC
  Filled 2018-03-30: qty 1

## 2018-03-30 MED ORDER — CALCIUM CARBONATE ANTACID 500 MG PO CHEW: 2.0000 | CHEWABLE_TABLET | ORAL | Status: DC | PRN

## 2018-03-30 MED ORDER — PROPOFOL 10 MG/ML IV BOLUS
INTRAVENOUS | Status: DC | PRN
  Administered 2018-03-30: 20 mg via INTRAVENOUS
  Administered 2018-03-30: 180 mg via INTRAVENOUS

## 2018-03-30 MED ORDER — FENTANYL CITRATE (PF) 250 MCG/5ML IJ SOLN
INTRAMUSCULAR | Status: AC
  Filled 2018-03-30: qty 5

## 2018-03-30 MED ORDER — FENTANYL CITRATE (PF) 100 MCG/2ML IJ SOLN
INTRAMUSCULAR | Status: DC | PRN
  Administered 2018-03-30: 50 ug via INTRAVENOUS
  Administered 2018-03-30: 100 ug via INTRAVENOUS
  Administered 2018-03-30 (×2): 50 ug via INTRAVENOUS

## 2018-03-30 MED ORDER — ONDANSETRON HCL 4 MG/2ML IJ SOLN
INTRAMUSCULAR | Status: DC | PRN
  Administered 2018-03-30: 4 mg via INTRAVENOUS

## 2018-03-30 MED ORDER — ROCURONIUM BROMIDE 100 MG/10ML IV SOLN
INTRAVENOUS | Status: DC | PRN
  Administered 2018-03-30: 10 mg via INTRAVENOUS
  Administered 2018-03-30: 30 mg via INTRAVENOUS

## 2018-03-30 MED ORDER — ZOLPIDEM TARTRATE 5 MG PO TABS: 5.0000 mg | ORAL_TABLET | Freq: Every evening | ORAL | Status: DC | PRN

## 2018-03-30 MED ORDER — SUGAMMADEX SODIUM 500 MG/5ML IV SOLN
INTRAVENOUS | Status: DC | PRN
  Administered 2018-03-30: 300 mg via INTRAVENOUS

## 2018-03-30 MED ORDER — FENTANYL CITRATE (PF) 100 MCG/2ML IJ SOLN
50.0000 ug | Freq: Once | INTRAMUSCULAR | Status: DC
  Filled 2018-03-30: qty 2

## 2018-03-30 MED ORDER — DEXAMETHASONE SODIUM PHOSPHATE 10 MG/ML IJ SOLN
INTRAMUSCULAR | Status: AC
  Filled 2018-03-30: qty 1

## 2018-03-30 MED ORDER — FENTANYL CITRATE (PF) 100 MCG/2ML IJ SOLN
100.0000 ug | Freq: Once | INTRAMUSCULAR | Status: AC
  Administered 2018-03-30: 100 ug via INTRAVENOUS

## 2018-03-30 MED ORDER — LACTATED RINGERS IV SOLN
INTRAVENOUS | Status: DC
  Administered 2018-03-30 (×3): via INTRAVENOUS

## 2018-03-30 MED ORDER — PROPOFOL 10 MG/ML IV BOLUS
INTRAVENOUS | Status: AC
  Filled 2018-03-30: qty 20

## 2018-03-30 MED ORDER — KETOROLAC TROMETHAMINE 30 MG/ML IJ SOLN
INTRAMUSCULAR | Status: DC | PRN
  Administered 2018-03-30: 30 mg via INTRAVENOUS

## 2018-03-30 MED ORDER — SODIUM CHLORIDE 0.9 % IR SOLN
Status: DC | PRN
  Administered 2018-03-30: 6000 mL

## 2018-03-30 MED ORDER — SCOPOLAMINE 1 MG/3DAYS TD PT72
MEDICATED_PATCH | TRANSDERMAL | Status: AC
  Filled 2018-03-30: qty 1

## 2018-03-30 SURGICAL SUPPLY — 33 items
ADH SKN CLS APL DERMABOND .7 (GAUZE/BANDAGES/DRESSINGS)
ADH SKN CLS LQ APL DERMABOND (GAUZE/BANDAGES/DRESSINGS) ×1
APL SRG 38 LTWT LNG FL B (MISCELLANEOUS) ×1
APPLICATOR ARISTA FLEXITIP XL (MISCELLANEOUS) ×1 IMPLANT
BAG SPEC RTRVL LRG 6X4 10 (ENDOMECHANICALS) ×1
CABLE HIGH FREQUENCY MONO STRZ (ELECTRODE) IMPLANT
DERMABOND ADHESIVE PROPEN (GAUZE/BANDAGES/DRESSINGS) ×1
DERMABOND ADVANCED (GAUZE/BANDAGES/DRESSINGS)
DERMABOND ADVANCED .7 DNX12 (GAUZE/BANDAGES/DRESSINGS) ×1 IMPLANT
DERMABOND ADVANCED .7 DNX6 (GAUZE/BANDAGES/DRESSINGS) IMPLANT
DRSG OPSITE POSTOP 3X4 (GAUZE/BANDAGES/DRESSINGS) ×3 IMPLANT
FORCEPS CUTTING 33CM 5MM (CUTTING FORCEPS) ×1 IMPLANT
GAUZE SPONGE 4X4 16PLY XRAY LF (GAUZE/BANDAGES/DRESSINGS) ×1 IMPLANT
GLOVE BIO SURGEON STRL SZ7.5 (GLOVE) ×2 IMPLANT
GLOVE BIOGEL PI IND STRL 7.0 (GLOVE) ×1 IMPLANT
GLOVE BIOGEL PI INDICATOR 7.0 (GLOVE) ×1
GOWN STRL REUS W/TWL LRG LVL3 (GOWN DISPOSABLE) ×2 IMPLANT
HEMOSTAT ARISTA ABSORB 3G PWDR (MISCELLANEOUS) ×1 IMPLANT
NEEDLE INSUFFLATION 120MM (ENDOMECHANICALS) ×2 IMPLANT
PACK LAPAROSCOPY BASIN (CUSTOM PROCEDURE TRAY) ×2 IMPLANT
PACK TRENDGUARD 450 HYBRID PRO (MISCELLANEOUS) IMPLANT
POUCH SPECIMEN RETRIEVAL 10MM (ENDOMECHANICALS) ×1 IMPLANT
PROTECTOR NERVE ULNAR (MISCELLANEOUS) ×4 IMPLANT
SET IRRIG TUBING LAPAROSCOPIC (IRRIGATION / IRRIGATOR) ×1 IMPLANT
SOLUTION ELECTROLUBE (MISCELLANEOUS) ×2 IMPLANT
SUT VICRYL 0 UR6 27IN ABS (SUTURE) ×2 IMPLANT
SUT VICRYL 4-0 PS2 18IN ABS (SUTURE) ×4 IMPLANT
TOWEL OR 17X24 6PK STRL BLUE (TOWEL DISPOSABLE) ×4 IMPLANT
TRAY FOLEY W/BAG SLVR 14FR (SET/KITS/TRAYS/PACK) ×2 IMPLANT
TRENDGUARD 450 HYBRID PRO PACK (MISCELLANEOUS) ×2
TROCAR OPTI TIP 5M 100M (ENDOMECHANICALS) ×2 IMPLANT
TROCAR XCEL DIL TIP R 11M (ENDOMECHANICALS) ×2 IMPLANT
WARMER LAPAROSCOPE (MISCELLANEOUS) ×2 IMPLANT

## 2018-03-30 NOTE — Anesthesia Postprocedure Evaluation (Signed)
Anesthesia Post Note  Patient: Kolbie S Wedig  Procedure(s) Performed: DIAGNOSTIC LAPAROSCOPY WITH REMOVAL OF Ruptured Right Ovarian Cyst (N/A )     Patient location during evaluation: PACU Anesthesia Type: General Level of consciousness: sedated Pain management: pain level controlled Vital Signs Assessment: post-procedure vital signs reviewed and stable Respiratory status: spontaneous breathing and respiratory function stable Cardiovascular status: stable Postop Assessment: no apparent nausea or vomiting Anesthetic complications: no    Last Vitals:  Vitals:   03/30/18 1315 03/30/18 1330  BP: 95/63 96/63  Pulse: 77 82  Resp: 17 18  Temp:    SpO2: 100% 100%    Last Pain:  Vitals:   03/30/18 1330  TempSrc:   PainSc: 0-No pain   Pain Goal: Patients Stated Pain Goal: 2 (03/30/18 0200)               Colvin Blatt DANIEL

## 2018-03-30 NOTE — Anesthesia Preprocedure Evaluation (Signed)
Anesthesia Evaluation  Patient identified by MRN, date of birth, ID band Patient awake    Reviewed: Allergy & Precautions, NPO status , Patient's Chart, lab work & pertinent test results  History of Anesthesia Complications Negative for: history of anesthetic complications  Airway Mallampati: II  TM Distance: >3 FB Neck ROM: Full    Dental no notable dental hx. (+) Dental Advisory Given   Pulmonary neg pulmonary ROS, former smoker,    Pulmonary exam normal        Cardiovascular negative cardio ROS Normal cardiovascular exam     Neuro/Psych negative neurological ROS  negative psych ROS   GI/Hepatic negative GI ROS, Neg liver ROS,   Endo/Other  Morbid obesity  Renal/GU negative Renal ROS  negative genitourinary   Musculoskeletal negative musculoskeletal ROS (+)   Abdominal   Peds negative pediatric ROS (+)  Hematology negative hematology ROS (+)   Anesthesia Other Findings   Reproductive/Obstetrics (+) Pregnancy                             Anesthesia Physical Anesthesia Plan  ASA: II and emergent  Anesthesia Plan: General   Post-op Pain Management:    Induction: Intravenous, Rapid sequence and Cricoid pressure planned  PONV Risk Score and Plan: 4 or greater and Ondansetron, Dexamethasone, Scopolamine patch - Pre-op and Diphenhydramine  Airway Management Planned: Oral ETT  Additional Equipment:   Intra-op Plan:   Post-operative Plan: Extubation in OR  Informed Consent: I have reviewed the patients History and Physical, chart, labs and discussed the procedure including the risks, benefits and alternatives for the proposed anesthesia with the patient or authorized representative who has indicated his/her understanding and acceptance.   Dental advisory given  Plan Discussed with: Anesthesiologist, Surgeon and CRNA  Anesthesia Plan Comments:         Anesthesia Quick  Evaluation

## 2018-03-30 NOTE — Progress Notes (Signed)
Patient ID: Carol CooleyMegan S Houser, female   DOB: 05-24-1981, 37 y.o.   MRN: [id number] Discussed ultrasound findings with radiologist and patient. Cannot rule out heterotopic pregnancy.  Will proceed with Diag LS due to large unchanged hemoperitoneum and change in hematocrit.  Pt and husband agree with the plan. OR notified.  Patient seen and examined. Consent witnessed and signed. No changes noted. Update completed.

## 2018-03-30 NOTE — Anesthesia Procedure Notes (Signed)
Procedure Name: Intubation Date/Time: 03/30/2018 10:43 AM Performed by: Heather RobertsSinger, James, MD Pre-anesthesia Checklist: Patient identified, Patient being monitored, Timeout performed, Emergency Drugs available and Suction available Patient Re-evaluated:Patient Re-evaluated prior to induction Oxygen Delivery Method: Circle System Utilized Preoxygenation: Pre-oxygenation with 100% oxygen Induction Type: IV induction Ventilation: Mask ventilation without difficulty Laryngoscope Size: Miller and 2 Grade View: Grade II Tube type: Oral Tube size: 7.0 mm Number of attempts: 1 Airway Equipment and Method: stylet Placement Confirmation: ETT inserted through vocal cords under direct vision,  positive ETCO2 and breath sounds checked- equal and bilateral Secured at: 21 cm Tube secured with: Tape Dental Injury: Teeth and Oropharynx as per pre-operative assessment

## 2018-03-30 NOTE — ED Provider Notes (Signed)
Care assumed from previous provider PA Kirichenko. Please see their note for further details to include full history and physical. To summarize in short pt is a 37 year old female presents to the ED for evaluation of lower abdominal pain, rectal pain, nausea and vomiting.  She did [redacted] weeks pregnant.  Reports miscarriage in December 2018. Case discussed, plan agreed upon.   At time of care handoff was awaiting ultrasound.  Prior provider was concerned given the elevated white blood cell count for possible cholecystitis.  To evaluate patient.  She does have significant tenderness to the lower abdomen.  No rigid abdomen noted the patient is having some rebound.  Patient is reporting pain to her epigastric region her bilateral shoulders.  Was notified at 1048 by radiologist that patient had free fluid concerning for right ruptured ectopic pregnancy.    Patient's beta hCG is 1600.  Please call to OB/GYN at 1100 PM.  Spoke with Dr. Shawnie PonsPratt.  She [facility] by her.  Had my attending evaluate patient this was Dr. Jodi MourningZavitz.  Ultrasound-guided IV was placed.  Patient remains tachycardic at 112.  Her blood pressure remained stable at 122/87.  Her abdomen continues to be tender but patient refused pain medication.  Repeat hemoglobin down to 10.2 from 12.3.  Patient although is very stable at this time.  He does have private OB/GYN.  Spoke with Carlean JewsMeredith Sigmon CNM [facility].  She has paged her on-call physician.  She recommends that we transfer patient to the MAU.  Was notified by her is that the on-call physician is aware and will [facility].  Patient was updated on plan of care.  She is comfortable with plan.  Patient remained stable for transport and [facility] is here for transfer this time.  CRITICAL CARE Performed by: Demetrios LollKenneth Leaphart   Total critical care time: 50  minutes  Critical care time was exclusive of separately billable procedures and treating other patients.  Critical care was necessary to treat or prevent imminent or life-threatening deterioration.  Critical care was time spent personally by me on the following activities: development of treatment plan with patient and/or surrogate as well as nursing, discussions with consultants, evaluation of patient's response to treatment, examination of patient, obtaining history from patient or surrogate, ordering and performing treatments and interventions, ordering and review of laboratory studies, ordering and review of radiographic studies, pulse oximetry and re-evaluation of patient's condition.          Rise MuLeaphart, Kenneth T, PA-C 03/30/18 0017    Blane OharaZavitz, Joshua, MD 03/30/18 Jacinta [id number]

## 2018-03-30 NOTE — Transfer of Care (Signed)
Immediate Anesthesia Transfer of Care Note  Patient: Carol Webb  Procedure(s) Performed: DIAGNOSTIC LAPAROSCOPY WITH REMOVAL OF Ruptured Right Ovarian Cyst (N/A )  Patient Location: PACU  Anesthesia Type:General  Level of Consciousness: awake, alert  and oriented  Airway & Oxygen Therapy: Patient Spontanous Breathing and Patient connected to nasal cannula oxygen  Post-op Assessment: Report given to RN, Post -op Vital signs reviewed and stable and Patient moving all extremities X 4  Post vital signs: Reviewed and stable  Last Vitals:  Vitals Value Taken Time  BP    Temp    Pulse 86 03/30/2018 12:39 PM  Resp 16 03/30/2018 12:39 PM  SpO2 100 % 03/30/2018 12:39 PM  Vitals shown include unvalidated device data.  Last Pain:  Vitals:   03/30/18 1013  TempSrc: Oral  PainSc:       Patients Stated Pain Goal: 2 (03/30/18 0200)  Complications: No apparent anesthesia complications

## 2018-03-30 NOTE — MAU Provider Note (Signed)
Chief Complaint: Abdominal Pain   First Provider Initiated Contact with Patient 03/30/18 0018      SUBJECTIVE HPI: Carol Webb is a 37 y.o. G2P0010 at Unknown by LMP who presents to maternity admissions transfer from [facility] for rule out ectopic pregnancy. She presents with abdominal pain. She reports upper abdominal pain in her epigastric region and lower abdominal cramping. She rates pain 6/10- has not taken any medication for pain since arrival to [facility] ED, [place] offered patient medication multiple times but patient declined. She reports increased pain that is making it difficult for her to breath. She reports associated symptoms of nausea but denies vomiting. She denies vaginal bleeding, vaginal itching/burning, urinary symptoms, h/a, dizziness or fever/chills.   Past Medical History:  Diagnosis Date  . Medical history non-contributory    Past Surgical History:  Procedure Laterality Date  . NO PAST SURGERIES     Social History   Socioeconomic History  . Marital status: Married    Spouse name: Not on file  . Number of children: Not on file  . Years of education: Not on file  . Highest education level: Not on file  Occupational History  . Not on file  Social Needs  . Financial resource strain: Not on file  . Food insecurity:    Worry: Not on file    Inability: Not on file  . Transportation needs:    Medical: Not on file    Non-medical: Not on file  Tobacco Use  . Smoking status: Former Smoker    Types: Cigarettes  . Smokeless tobacco: Never Used  Substance and Sexual Activity  . Alcohol use: Yes    Comment: one weeka go may 2019  . Drug use: Yes    Types: Marijuana    Comment: last use Mar 21 2018  . Sexual activity: Yes    Birth control/protection: None  Lifestyle  . Physical activity:    Days per week: Not on file    Minutes per session: Not on file  . Stress: Not on file  Relationships  . Social connections:    Talks on phone: Not on file    Gets  together: Not on file    Attends religious service: Not on file    Active member of club or organization: Not on file    Attends meetings of clubs or organizations: Not on file    Relationship status: Not on file  . Intimate partner violence:    Fear of current or ex partner: Not on file    Emotionally abused: Not on file    Physically abused: Not on file    Forced sexual activity: Not on file  Other Topics Concern  . Not on file  Social History Narrative  . Not on file   No current facility-administered medications on file prior to encounter.    Current Outpatient Medications on File Prior to Encounter  Medication Sig Dispense Refill  . cholecalciferol (VITAMIN D) 1000 units tablet Take 1,000 Units by mouth daily.    . folic acid (FOLVITE) 800 MCG tablet Take 400 mcg by mouth daily.    . Multiple Vitamin (MULTIVITAMIN WITH MINERALS) TABS tablet Take 1 tablet by mouth daily.     No Known Allergies  ROS:  Review of Systems  Constitutional: Negative.   Respiratory: Positive for shortness of breath. Negative for apnea, chest tightness and wheezing.   Cardiovascular: Negative.   Gastrointestinal: Positive for abdominal pain and nausea. Negative for constipation,  diarrhea and vomiting.  Genitourinary: Negative.    I have reviewed patient's Past Medical Hx, Surgical Hx, Family Hx, Social Hx, medications and allergies.   Physical Exam   Patient Vitals for the past 24 hrs:  BP Temp Temp src Pulse Resp SpO2 Height Weight  03/30/18 0012 129/78 98.8 F (37.1 C) Oral (!) 112 18 - - -  03/29/18 2324 122/87 - - (!) 112 (!) 24 100 % - -  03/29/18 2300 109/84 - - (!) 106 18 100 % - -  03/29/18 1833 119/87 97.6 F (36.4 C) Oral (!) 114 17 100 % - -  03/29/18 1832 - - - - - - 5\' 7"  (1.702 m) 227 lb (103 kg)   Constitutional: Well-developed, well-nourished female in no acute distress.  Cardiovascular: normal rate Respiratory: normal effort, no diminished breath sound or wheezing noted.   GI: Abd soft, mildly tender in lower left quadrant with palpation, no guarding or rebound. Pos BS x 4 MS: Extremities nontender, no edema, normal ROM Neurologic: Alert and oriented x 4.  GU: Neg CVAT. PELVIC EXAM:deferred   LAB RESULTS Results for orders placed or performed during the hospital encounter of 03/29/18 (from the past 24 hour(s))  Lipase, blood     Status: None   Collection Time: 03/29/18  7:08 PM  Result Value Ref Range   Lipase 25 11 - 51 U/L  Comprehensive metabolic panel     Status: Abnormal   Collection Time: 03/29/18  7:08 PM  Result Value Ref Range   Sodium 130 (L) 135 - 145 mmol/L   Potassium 3.3 (L) 3.5 - 5.1 mmol/L   Chloride 100 (L) 101 - 111 mmol/L   CO2 18 (L) 22 - 32 mmol/L   Glucose, Bld 155 (H) 65 - 99 mg/dL   BUN 9 6 - 20 mg/dL   Creatinine, Ser 8.11 0.44 - 1.00 mg/dL   Calcium 9.0 8.9 - 91.4 mg/dL   Total Protein 7.1 6.5 - 8.1 g/dL   Albumin 4.1 3.5 - 5.0 g/dL   AST 21 15 - 41 U/L   ALT 19 14 - 54 U/L   Alkaline Phosphatase 49 38 - 126 U/L   Total Bilirubin 1.2 0.3 - 1.2 mg/dL   GFR calc non Af Amer >60 >60 mL/Carol   GFR calc Af Amer >60 >60 mL/Carol   Anion gap 12 5 - 15  CBC     Status: Abnormal   Collection Time: 03/29/18  7:08 PM  Result Value Ref Range   WBC 20.9 (H) 4.0 - 10.5 K/uL   RBC 3.87 3.87 - 5.11 MIL/uL   Hemoglobin 12.3 12.0 - 15.0 g/dL   HCT 78.2 95.6 - 21.3 %   MCV 95.6 78.0 - 100.0 fL   MCH 31.8 26.0 - 34.0 pg   MCHC 33.2 30.0 - 36.0 g/dL   RDW 08.6 57.8 - 46.9 %   Platelets 324 150 - 400 K/uL  Sample to Blood Bank     Status: None   Collection Time: 03/29/18  7:08 PM  Result Value Ref Range   Blood Bank Specimen SAMPLE AVAILABLE FOR TESTING    Sample Expiration      03/30/2018 Performed at [facility], [address], [place], [place] [address]   I-Stat beta hCG blood, ED     Status: Abnormal   Collection Time: 03/29/18  7:23 PM  Result Value Ref Range   I-stat hCG, quantitative 1,662.4 (H) <5 mIU/mL    Comment  3          Wet prep, genital     Status: Abnormal   Collection Time: 03/29/18  8:42 PM  Result Value Ref Range   Yeast Wet Prep HPF POC NONE SEEN NONE SEEN   Trich, Wet Prep NONE SEEN NONE SEEN   Clue Cells Wet Prep HPF POC PRESENT (A) NONE SEEN   WBC, Wet Prep HPF POC MANY (A) NONE SEEN   Sperm NONE SEEN   I-Stat Chem 8, ED     Status: Abnormal   Collection Time: 03/29/18 11:28 PM  Result Value Ref Range   Sodium 135 135 - 145 mmol/L   Potassium 4.0 3.5 - 5.1 mmol/L   Chloride 100 (L) 101 - 111 mmol/L   BUN 10 6 - 20 mg/dL   Creatinine, Ser 1.610.50 0.44 - 1.00 mg/dL   Glucose, Bld 096126 (H) 65 - 99 mg/dL   Calcium, Ion 0.451.18 4.091.15 - 1.40 mmol/L   TCO2 20 (L) 22 - 32 mmol/L   Hemoglobin 10.2 (L) 12.0 - 15.0 g/dL   HCT 81.130.0 (L) 91.436.0 - 78.246.0 %   IMAGING [place] Ob Comp < 14 Wks  Result Date: 03/29/2018 CLINICAL DATA:  Acute onset of pelvic pain. EXAM: OBSTETRIC <14 WK US AND TRANSVAGINAL OB US TECHNIQUE: Both transabdominal and transvaginal ultrasound examinations were performed for complete evaluation of the gestation as well as the maternal uterus, adnexal regions, and pelvic cul-de-sac. Transvaginal technique was performed to assess early pregnancy. COMPARISON:  None. FINDINGS: Intrauterine gestational sac: Question of tiny intrauterine gestational sac, though this could also reflect a pseudosac. Yolk sac:  No Embryo:  No MSD (possible intrauterine gestational sac): 3.7  mm   5 w   1  d Subchorionic hemorrhage:  None visualized. Maternal uterus/adnexae: The ovaries are not visualized. There is a complex heterogeneous structure filling the pelvic cul-de-sac extending into the right adnexa, measuring 12.0 x 4.0 x 9.5 cm. This is concerning for clot. A moderate to large amount of free fluid is noted within the pelvis. No definite ectopic pregnancy is characterized, but this raises concern for rupture of an ectopic pregnancy. Free fluid is also noted within the upper abdomen on concurrent right  upper quadrant ultrasound. IMPRESSION: 1. Complex heterogeneous structure filling the pelvic cul-de-sac, extending into the right adnexa, measuring 12.0 x 4.0 x 9.5 cm. This is concerning for clot. Moderate to large amount of free fluid noted within the abdomen and pelvis. Though no definite ectopic pregnancy is seen, this raises concern for ruptured ectopic pregnancy. Would correlate with the patient's symptoms and Hgb/Hct. 2. Question of tiny intrauterine gestational sac, though this could also reflect a pseudosac. Critical Value/emergent results were called by telephone at the time of interpretation on 03/29/2018 at 10:48 pm to [facility], who verbally acknowledged these results. Electronically Signed   By: Roanna RaiderJeffery  Chang M.D.   On: 03/29/2018 22:59   [place] Ob Transvaginal  Result Date: 03/29/2018 CLINICAL DATA:  Acute onset of pelvic pain. EXAM: OBSTETRIC <14 WK US AND TRANSVAGINAL OB US TECHNIQUE: Both transabdominal and transvaginal ultrasound examinations were performed for complete evaluation of the gestation as well as the maternal uterus, adnexal regions, and pelvic cul-de-sac. Transvaginal technique was performed to assess early pregnancy. COMPARISON:  None. FINDINGS: Intrauterine gestational sac: Question of tiny intrauterine gestational sac, though this could also reflect a pseudosac. Yolk sac:  No Embryo:  No MSD (possible intrauterine gestational sac): 3.7  mm   5 w   1  d Subchorionic hemorrhage:  None visualized. Maternal uterus/adnexae: The ovaries are not visualized. There is a complex heterogeneous structure filling the pelvic cul-de-sac extending into the right adnexa, measuring 12.0 x 4.0 x 9.5 cm. This is concerning for clot. A moderate to large amount of free fluid is noted within the pelvis. No definite ectopic pregnancy is characterized, but this raises concern for rupture of an ectopic pregnancy. Free fluid is also noted within the upper abdomen on concurrent right upper quadrant ultrasound.  IMPRESSION: 1. Complex heterogeneous structure filling the pelvic cul-de-sac, extending into the right adnexa, measuring 12.0 x 4.0 x 9.5 cm. This is concerning for clot. Moderate to large amount of free fluid noted within the abdomen and pelvis. Though no definite ectopic pregnancy is seen, this raises concern for ruptured ectopic pregnancy. Would correlate with the patient's symptoms and Hgb/Hct. 2. Question of tiny intrauterine gestational sac, though this could also reflect a pseudosac. Critical Value/emergent results were called by telephone at the time of interpretation on 03/29/2018 at 10:48 pm to [facility], who verbally acknowledged these results. Electronically Signed   By: Roanna Raider M.D.   On: 03/29/2018 22:59   US Abdomen Limited Ruq  Result Date: 03/29/2018 CLINICAL DATA:  37 year old female with history of abdominal pain this morning. Positive pregnancy test. EXAM: ULTRASOUND ABDOMEN LIMITED RIGHT UPPER QUADRANT COMPARISON:  None. FINDINGS: Gallbladder: No gallstones or wall thickening visualized. No sonographic Murphy sign noted by sonographer. Fluid adjacent to the gallbladder (nonspecific). Common bile duct: Diameter: 2.9 mm Liver: No focal lesion identified. Within normal limits in parenchymal echogenicity. Portal vein is patent on color Doppler imaging with normal direction of blood flow towards the liver. Other: Ascites. IMPRESSION: 1. No cholelithiasis or findings to suggest an acute cholecystitis. 2. Ascites. Electronically Signed   By: Trudie Reed M.D.   On: 03/29/2018 22:45    MAU Management/MDM: Orders Placed This Encounter  Procedures  . US OB Transvaginal  . CBC  . Diet NPO time specified Except for: Sips with Meds  . Type and screen [facility] [employer]  . Prepare RBC (crossmatch)  . Admit to Inpatient (patient's expected length of stay will be greater than 2 midnights or inpatient only procedure)   Labs reviewed that were drawn at [facility] ED- Labs WNL  [place]  results showed blood in cul de sac and questionable tiny IUP  HCG 1662   Meds ordered this encounter  Medications  . lactated ringers infusion  . promethazine (PHENERGAN) injection 12.5 mg  . fentaNYL (SUBLIMAZE) injection 100 mcg  . HYDROmorphone (DILAUDID) injection 1 mg    Treatments in MAU included of Fentanyl IV and phenergan 12.5mg  IV- patient reports nausea is relieved with phenergan but pain is still the same at 6/10. Patient request additional pain medication 35 minutes after Fentanyl given. Dilaudid 1mg  IV given for pain- pt reports pain is completely relieved, able to sleep while in MAU- also reports SOB is relieved once pain was decreased.    Consult Dr Billy Coast with assessment, results and management. Dr Billy Coast recommends obtaining Type and Cross, repeat CBC in the morning and repeat US in the morning. Recommends overnight observation with possible surgical intervention based on results in the morning.   Discussed options with patient- patient agrees to POC. Patient admitted to antenatal for observation with repeat US and lab work in the morning.    ASSESSMENT 1. Abdominal pain during pregnancy in first trimester   2. Abdominal pain   3. Pregnancy  of unknown anatomic location   4. Ruptured ectopic pregnancy     PLAN Admit to antenatal for observation  Repeat US in the morning  Repeat CBC in the morning  Care taken over by Dr Billy Coast  Orders placed for admission and repeat testing    Steward Drone  Certified Nurse-Midwife 03/30/2018  1:18 AM

## 2018-03-30 NOTE — Anesthesia Postprocedure Evaluation (Signed)
Anesthesia Post Note  Patient: Carol Webb  Procedure(s) Performed: DIAGNOSTIC LAPAROSCOPY WITH REMOVAL OF Ruptured Right Ovarian Cyst (N/A )     Patient location during evaluation: Women's Unit Anesthesia Type: General Level of consciousness: awake Pain management: satisfactory to patient Vital Signs Assessment: post-procedure vital signs reviewed and stable Respiratory status: spontaneous breathing Cardiovascular status: stable Anesthetic complications: no    Last Vitals:  Vitals:   03/30/18 1509 03/30/18 1604  BP: 98/61 109/74  Pulse: 84 89  Resp: 20 18  Temp: 36.9 C 36.8 C  SpO2: 100% 100%    Last Pain:  Vitals:   03/30/18 1604  TempSrc: Oral  PainSc:    Pain Goal: Patients Stated Pain Goal: 2 (03/30/18 1425)               Cephus ShellingBURGER,Genea Rheaume

## 2018-03-30 NOTE — Op Note (Signed)
Carol Webb, [place] MEDICAL RECORD [id number] ACCOUNT [id number] DATE OF BIRTH:Apr 17, 1981 FACILITY: [facility] LOCATION: [id number] PHYSICIAN:Antjuan Rothe J. Tessica Cupo, MD  OPERATIVE REPORT  DATE OF PROCEDURE:    PREOPERATIVE DIAGNOSIS:  Questionable ruptured ectopic pregnancy versus ruptured ovarian cyst, large hemoperitoneum.  POSTOPERATIVE DIAGNOSIS:  Probable ruptured right ovarian cyst.  SURGEON:  Olivia Mackie, MD   ASSISTANT:  Renae Fickle  ANESTHESIA:  General and local.  ESTIMATED BLOOD LOSS:  During the case approximately 50 mL.  Total amount of blood in the patient's abdomen close to a 1000 mL.  COMPLICATIONS:  None.  DRAINS:  None.  SPECIMENS:  Right ovarian cyst wall and clot to pathology.    DISPOSITION:  The patient to recovery in good condition.  COUNTS:  Correct.  DESCRIPTION OF PROCEDURE:  After being apprised of the risks of anesthesia, infection, bleeding, injury to surrounding organs, possible need for repair, delayed risks and complications including bowel and bladder injury, possible need for repair,  possibility of interfering with an otherwise normal first trimester gestation.  The patient's consents are signed and brought to the operating room.  She was administered general anesthetic without complications.  Prepped and draped in usual sterile  fashion.  Foley catheter placed.  Exam under anesthesia is deferred due to previous ultrasound.  Infraumbilical incision made with a scalpel.  A Veress needle placed.  Opening pressure of -1.  Four liters of CO2 insufflated without difficulty.  Trocar  placed atraumatically.  Visualization reveals a large hemoperitoneum.  At this time, 10 mm and 5 mm sites were made in the left and right lower quadrants under direct visualization.  Large ____ suction was placed evacuating all the hemoperitoneum  including multiple clots.  It does appear at this time that there is evidence that one of the epiploica from the sigmoid colon has adhered  to the right ovary, walling off a previously bleeding questionable right ovarian cyst.  Upon elevation of the right  ovarian cyst, there is active bleeding noted.  The cyst wall was cauterized and extruded and removed.  All edges of the ruptured cyst wall were cauterized.  Micheline Chapman is then also applied for hemostasis.  Left ovary appears normal.  Bilateral tubes  appeared normal.  Irrigation is accomplished.  Due to the large number of clots, 2 EndoCatch bags were used to extract the clot.  At this time, good hemostasis achieved.  Revisualization of the right ovary reveals good hemostasis and no active bleeding.   There is some what appears to be fibrinous, gelatinous tissue on the epiploica of the sigmoid colon, which is also sort of gently teased and also sent to pathology in addition to the right ovarian cyst wall.  No obvious products of conception are seen.   Uterus appears normal, but slightly enlarged.  Normal anterior cul-de-sac, normal posterior cul-de-sac.  Appendix is visualized and appears to be normal.  Gallbladder and liver area appear normal.  At this time, all instruments removed under direct  visualization.  CO2 was released.  Positive pressure applied.  The umbilical incision appears to have a dual, sort of that the skin has been tented in such a way that 2 small entry points are made.  These are closed using 0 Vicryl.  Marcaine was placed.   The left lower quadrant incision closed with 0 Vicryl, 4-0 Vicryl and the right lower quadrant incision closed with 4-0 Vicryl.  Marcaine applied to all incisions.  Minimal vaginal bleeding was noted.  Urine is clear.  The patient tolerated  the  procedure well.  Hemoglobin of 8.8.  She was transferred to recovery in good condition.  GN/NUANCE  D:03/30/2018 T:03/30/2018 JOB:[id number]

## 2018-03-30 NOTE — Op Note (Signed)
03/30/2018  12:26 PM  PATIENT:  Carol Webb  37 y.o. female  PRE-OPERATIVE DIAGNOSIS:  ruptured ectopic vs ruptured ovarian cyst hemoperitoneum  POST-OPERATIVE DIAGNOSIS:  Ruptured Right Ovarian Cyst Large hemoperitoneum  PROCEDURE:  Procedure(s): DIAGNOSTIC LAPAROSCOPY WITH REMOVAL OF Ruptured Right Ovarian Cyst Lysis of sigmoid to right ovarian adhesions  SURGEON:  Surgeon(s): Olivia Mackieaavon, Chery Giusto, MD  ASSISTANTSRenae Fickle: Paul, CNM   ANESTHESIA:   local and general  ESTIMATED BLOOD LOSS: 50cc during case, est hemoperitoneum 800cc  DRAINS: Urinary Catheter (Foley)   LOCAL MEDICATIONS USED:  MARCAINE    and Amount: 30 ml  SPECIMEN:  Source of Specimen:  right ovarian cyst wall  DISPOSITION OF SPECIMEN:  PATHOLOGY  COUNTS:  YES  DICTATION #: [id number]  PLAN OF CARE: admit  PATIENT DISPOSITION:  PACU - hemodynamically stable.

## 2018-03-30 NOTE — Progress Notes (Signed)
Patient ID: Carol Webb, female   DOB: 1981/01/17, [age] y.o.   MRN: [id number] S: No complaints No pain this morning since received Dilaudid and Fentanyl at 0100 No bleeding. No SOB Sitting up in bed. O: BP 105/66 (BP Location: Left Arm)   Pulse 89   Temp 98.8 F (37.1 C) (Oral)   Resp 20   Ht 5\' 7"  (1.702 m)   Wt 103 kg (227 lb)   LMP 02/20/2018   SpO2 100%   BMI 35.55 kg/m   ABD: soft, Nontender, No rebound or guarding CBC    Component Value Date/Time   WBC 11.0 (H) 03/30/2018 0610   RBC 2.68 (L) 03/30/2018 0610   HGB 8.8 (L) 03/30/2018 0610   HCT 25.2 (L) 03/30/2018 0610   PLT 208 03/30/2018 0610   MCV 94.0 03/30/2018 0610   MCH 32.8 03/30/2018 0610   MCHC 34.9 03/30/2018 0610   RDW 13.5 03/30/2018 0610   LYMPHSABS 1.6 06/15/2014 1144   MONOABS 0.6 06/15/2014 1144   EOSABS 0.1 06/15/2014 1144   BASOSABS 0.0 06/15/2014 1144   A: Ruptured Ectopic vs ruptured ovarian cyst. Clinically stable.  P: Will discuss rpt sonogram with radiology. Risks of interfering with ongoing early IUP( quant 1772) with surgical intervention discussed.

## 2018-03-30 NOTE — Plan of Care (Signed)
Pt. Progressing well post-op. Tolerating diet. No complaint of pain. Ambulating. Foley removed this Pm. DTV. Will monitor.

## 2018-03-30 NOTE — Addendum Note (Signed)
Addendum  created 03/30/18 1821 by Algis GreenhouseBurger, Aleshia Cartelli A, CRNA   Sign clinical note

## 2018-03-30 NOTE — Progress Notes (Signed)
Patient ID: Carol Webb, female   DOB: Aug 18, 1981, 37 y.o.   MRN: [id number] Case discussed with ER provider.  Sonogram and lab results reviewed.  Pt. Presented to ER with rectal pain and upper abdominal pain. Seen in office twice with desired pregnancy and appropriately rising quantitative HCGs. No history of PID or abdominal surgery. History of MAB previously noted. [facility].  Upon Presentation pt noted upper abdominal pain and vague lower abdominal cramping. MAU provider noted non surgical abdomen with no rebound or guarding. No pain meds had been received. Pt received one dose of Fentanyl in MAU.  VS stable.  Decision made for overnight observation with repeat sono and CBC in AM. Pt agrees with decision.

## 2018-03-30 NOTE — Progress Notes (Signed)
Patient ID: Carol CooleyMegan S Roblero, female   DOB: [birth year], 37 y.o.   MRN: [id number] Pt sleeping all night No complaints of pain since transfer from MAU. BP 116/73 (BP Location: Left Arm)   Pulse 88   Temp 98.6 F (37 C) (Oral)   Resp 17   Ht 5\' 7"  (1.702 m)   Wt 103 kg (227 lb)   LMP 02/20/2018   SpO2 100%   BMI 35.55 kg/m   Sonogram and CBC ordered for this AM Decision about surgical intervention pending results.

## 2018-03-30 NOTE — H&P (Signed)
NAMBobbye [place]: Webb, [name] MEDICAL RECORD [id number] ACCOUNT [id number] DATE OF BIRTH:Mar 03, 1981 FACILITY: [facility] LOCATION: [id number] PHYSICIAN:Early Ord J. Billy CoastAAVON, MD  HISTORY AND PHYSICAL  DATE OF ADMISSION:  03/30/2018  CHIEF COMPLAINT:   Upper abdominal pain, rectal pressure, transferred from [facility] Emergency Room.  HISTORY OF PRESENT ILLNESS:  The patient is a 37 year old white female G2 P0 with desired pregnancy having taken ovulation induction to conceive now presenting to the emergency room with lower abdominal cramping, rectal pressure and upper abdominal pain.   She has had hCG titers in the office which have risen appropriately from 280 to over 500 in over a 48-hour period.    ALLERGIES:  She has no known drug allergies.  FAMILY HISTORY:  Noncontributory.  SOCIAL HISTORY:  Noncontributory.  She has had 1 previous pregnancy loss.  HOSPITAL COURSE:  During the presentation [facility] room, evaluation of pregnancy was taken during the course of her evaluation for possible gallbladder pathology revealing complex fluid in the pelvis suspicious for a possible ruptured  ectopic pregnancy.  No definitive ectopic pregnancy was discovered.  There was a sac versus a pseudo sac noted in the uterus. Possible ruptured ovarian cyst vs other etiologies discussed.  Her hemoglobin was previously 13 on admission after IV fluids and observation.  Repeat hemoglobin was just over 10.  She was  transferred to the hospital with minimal abdominal pain.  Upon presentation, it was noted she had a nonsurgical abdomen, with lower abdominal cramping and was given a dose of fentanyl and Dilaudid in the emergency room and was admitted for further  observation.  PHYSICAL EXAMINATION: GENERAL:  A well-developed, well-nourished white female in no acute distress.   HEENT:  Normal. NECK:  Supple, full range of motion. LUNGS:  Clear. HEART:  Regular rhythm. ABDOMEN:  Soft, obese, nontender. PELVIC:   Deferred at this time. EXTREMITIES:  Show no cords. NEUROLOGIC:  Nonfocal. SKIN:  Intact.  ASSESSMENT AND PLAN:  Lower abdominal pain of unknown etiology, suspicious for possible ruptured ectopic pregnancy vs ruptured ovarian cyst vs ? etx.  The patient's vital signs are stable.  Has appropriately rising hCG titers and desired pregnancy.  Discussed with the patient  possibilities for surgical intervention, which could potentially interfere with an ongoing intrauterine pregnancy at this time.  We will repeat CBC.  Repeat ultrasound and assess the need for operative intervention.  The patient was typed and crossed for  2 units of blood upon admission.  TN/NUANCE  D:03/30/2018 T:03/30/2018 JOB:[id number]

## 2018-03-30 NOTE — MAU Note (Signed)
Pt transferred via EMS from [place] for r/o ruptured ectopic. Pt denies any vag bleeding, but is experiencing moderate pain in her low right side of abdomen and sharp, severe pain that radiates across both clavicles upon each breath. IV infusing in right upper arm. C/o diarrhea X4 today and nausea.

## 2018-03-31 ENCOUNTER — Other Ambulatory Visit (HOSPITAL_COMMUNITY): Payer: BLUE CROSS/BLUE SHIELD

## 2018-03-31 ENCOUNTER — Encounter (HOSPITAL_COMMUNITY): Payer: Self-pay | Admitting: Obstetrics and Gynecology

## 2018-03-31 MED ORDER — SODIUM CHLORIDE 0.9 % IV SOLN
510.0000 mg | Freq: Once | INTRAVENOUS | Status: AC
  Administered 2018-03-31: 510 mg via INTRAVENOUS
  Filled 2018-03-31: qty 17

## 2018-03-31 MED ORDER — OXYCODONE-ACETAMINOPHEN 5-325 MG PO TABS: 1.0000 | ORAL_TABLET | ORAL | 0 refills | Status: DC | PRN

## 2018-03-31 NOTE — Discharge Summary (Signed)
1 Day Post-Op Procedure(s) (LRB): DIAGNOSTIC LAPAROSCOPY WITH REMOVAL OF Ruptured Right Ovarian Cyst (N/A)  Subjective: Patient reports nausea, incisional pain, tolerating PO, + flatus and no problems voiding.    Objective: I have reviewed patient's vital signs, intake and output, medications and labs.  General: alert, cooperative and appears stated age Resp: clear to auscultation bilaterally and normal percussion bilaterally Cardio: regular rate and rhythm, S1, S2 normal, no murmur, click, rub or gallop GI: soft, non-tender; bowel sounds normal; no masses,  no organomegaly and incision: clean, dry and intact Extremities: Homans sign is negative, no sign of DVT Vaginal Bleeding: minimal CBC    Component Value Date/Time   WBC 12.2 (H) 03/30/2018 1807   RBC 2.77 (L) 03/30/2018 1807   HGB 8.9 (L) 03/30/2018 1807   HCT 26.5 (L) 03/30/2018 1807   PLT 237 03/30/2018 1807   MCV 95.7 03/30/2018 1807   MCH 32.1 03/30/2018 1807   MCHC 33.6 03/30/2018 1807   RDW 13.5 03/30/2018 1807   LYMPHSABS 1.6 06/15/2014 1144   MONOABS 0.6 06/15/2014 1144   EOSABS 0.1 06/15/2014 1144   BASOSABS 0.0 06/15/2014 1144    Assessment: s/p Procedure(s): DIAGNOSTIC LAPAROSCOPY WITH REMOVAL OF Ruptured Right Ovarian Cyst (N/A): stable  Anemia  Plan: IV iron Advance diet Encourage ambulation Advance to PO medication Discontinue IV fluids Discharge home  LOS: 1 day    Wilfred Dayrit J 03/31/2018, 6:45 AM

## 2018-03-31 NOTE — Progress Notes (Signed)
Patient ID: Carol Webb, female   DOB: 13-Jun-1981, 37 y.o.   MRN: [id number] Pt ambulated  Out with family  Teaching complete

## 2018-04-01 DIAGNOSIS — Z3A01 Less than 8 weeks gestation of pregnancy: Secondary | ICD-10-CM | POA: Diagnosis not present

## 2018-04-01 DIAGNOSIS — O09291 Supervision of pregnancy with other poor reproductive or obstetric history, first trimester: Secondary | ICD-10-CM | POA: Diagnosis not present

## 2018-04-01 LAB — TYPE AND SCREEN
ABO/RH(D): O POS
Antibody Screen: NEGATIVE
Unit division: 0
Unit division: 0

## 2018-04-01 LAB — BPAM RBC
Blood Product Expiration Date: 201906232359
Blood Product Expiration Date: 201906272359
ISSUE DATE / TIME: 201906050901
ISSUE DATE / TIME: 201906051130
Unit Type and Rh: 5100
Unit Type and Rh: 5100

## 2018-04-03 NOTE — Discharge Summary (Signed)
NAMBobbye Webb: Webb, [name] MEDICAL RECORD [id number] ACCOUNT [id number] DATE OF BIRTH:1981/03/08 FACILITY: [facility] LOCATION: [id number] PHYSICIAN:Lelania Bia J. Billy CoastAAVON, MD  DISCHARGE SUMMARY  DATE OF DISCHARGE:  03/31/2018  ADMITTING DIAGNOSES:  Hemoperitoneum.  DISCHARGE DIAGNOSIS:  Ruptured right ovarian cyst.  HISTORY OF PRESENT ILLNESS:  The patient was admitted after consultation from the [facility] Emergency Room with a questionable intrauterine pregnancy versus a possible heterotopic pregnancy and ruptured ectopic.    Upon presentation, the patient was stable.  Vital signs are stable.  Hemoglobin was stable.  She had a nonsurgical abdomen and was admitted for observation and repeat ultrasound after observation.  Repeat ultrasound showed on the morning of 06/03 showed  a pneumoperitoneum with a large amount of free fluid in the abdomen, no evidence of ectopic pregnancy, no evidence of ovarian cyst and a gestational sac, which was consistent with the patient's last menstrual period.    After discussion with radiologist and the patient, the decision was made to proceed with diagnostic laparoscopy.  The patient had diagnostic laparoscopy, which revealed a large hemoperitoneum.  No evidence of ectopic pregnancy, questionable ruptured  right ovarian cyst.  Surgical evacuation of hemoperitoneum and cauterization and excision of right ovarian cyst was performed without difficulty.  The patient remained stable.    Postoperatively revealed IV iron.  Hemoglobin stable, was discharged to home for followup in 1 week.  DISCHARGE MEDICATIONS:  Prenatal vitamins and iron.  Discharge teaching was done.  TN/NUANCE D:04/03/2018 T:04/03/2018 JOB:[id number]

## 2018-04-13 DIAGNOSIS — Z3201 Encounter for pregnancy test, result positive: Secondary | ICD-10-CM | POA: Diagnosis not present

## 2018-05-04 DIAGNOSIS — Z3491 Encounter for supervision of normal pregnancy, unspecified, first trimester: Secondary | ICD-10-CM | POA: Diagnosis not present

## 2018-05-04 DIAGNOSIS — Z3481 Encounter for supervision of other normal pregnancy, first trimester: Secondary | ICD-10-CM | POA: Diagnosis not present

## 2018-05-04 DIAGNOSIS — Z36 Encounter for antenatal screening for chromosomal anomalies: Secondary | ICD-10-CM | POA: Diagnosis not present

## 2018-05-04 DIAGNOSIS — Z118 Encounter for screening for other infectious and parasitic diseases: Secondary | ICD-10-CM | POA: Diagnosis not present

## 2018-05-04 DIAGNOSIS — Z113 Encounter for screening for infections with a predominantly sexual mode of transmission: Secondary | ICD-10-CM | POA: Diagnosis not present

## 2018-05-04 DIAGNOSIS — Z3689 Encounter for other specified antenatal screening: Secondary | ICD-10-CM | POA: Diagnosis not present

## 2018-05-04 LAB — OB RESULTS CONSOLE HIV ANTIBODY (ROUTINE TESTING): HIV: NONREACTIVE

## 2018-05-04 LAB — OB RESULTS CONSOLE GC/CHLAMYDIA: GC PROBE AMP, GENITAL: NEGATIVE

## 2018-05-04 LAB — OB RESULTS CONSOLE RUBELLA ANTIBODY, IGM: Rubella: IMMUNE

## 2018-05-04 LAB — OB RESULTS CONSOLE RPR: RPR: NONREACTIVE

## 2018-05-04 LAB — OB RESULTS CONSOLE HEPATITIS B SURFACE ANTIGEN: Hepatitis B Surface Ag: NEGATIVE

## 2018-05-18 DIAGNOSIS — Z3481 Encounter for supervision of other normal pregnancy, first trimester: Secondary | ICD-10-CM | POA: Diagnosis not present

## 2018-05-18 DIAGNOSIS — Z3682 Encounter for antenatal screening for nuchal translucency: Secondary | ICD-10-CM | POA: Diagnosis not present

## 2018-06-15 DIAGNOSIS — Z361 Encounter for antenatal screening for raised alphafetoprotein level: Secondary | ICD-10-CM | POA: Diagnosis not present

## 2018-06-15 DIAGNOSIS — Z3482 Encounter for supervision of other normal pregnancy, second trimester: Secondary | ICD-10-CM | POA: Diagnosis not present

## 2018-07-06 DIAGNOSIS — O09522 Supervision of elderly multigravida, second trimester: Secondary | ICD-10-CM | POA: Diagnosis not present

## 2018-07-06 DIAGNOSIS — Z3A19 19 weeks gestation of pregnancy: Secondary | ICD-10-CM | POA: Diagnosis not present

## 2018-07-06 DIAGNOSIS — Z3686 Encounter for antenatal screening for cervical length: Secondary | ICD-10-CM | POA: Diagnosis not present

## 2018-07-23 DIAGNOSIS — Z3686 Encounter for antenatal screening for cervical length: Secondary | ICD-10-CM | POA: Diagnosis not present

## 2018-07-23 DIAGNOSIS — Z23 Encounter for immunization: Secondary | ICD-10-CM | POA: Diagnosis not present

## 2018-07-23 DIAGNOSIS — Z362 Encounter for other antenatal screening follow-up: Secondary | ICD-10-CM | POA: Diagnosis not present

## 2018-07-23 DIAGNOSIS — O4442 Low lying placenta NOS or without hemorrhage, second trimester: Secondary | ICD-10-CM | POA: Diagnosis not present

## 2018-07-23 DIAGNOSIS — Z3A21 21 weeks gestation of pregnancy: Secondary | ICD-10-CM | POA: Diagnosis not present

## 2018-08-20 DIAGNOSIS — Z3A25 25 weeks gestation of pregnancy: Secondary | ICD-10-CM | POA: Diagnosis not present

## 2018-08-20 DIAGNOSIS — O4442 Low lying placenta NOS or without hemorrhage, second trimester: Secondary | ICD-10-CM | POA: Diagnosis not present

## 2018-08-20 DIAGNOSIS — Z3689 Encounter for other specified antenatal screening: Secondary | ICD-10-CM | POA: Diagnosis not present

## 2018-09-11 DIAGNOSIS — O4443 Low lying placenta NOS or without hemorrhage, third trimester: Secondary | ICD-10-CM | POA: Diagnosis not present

## 2018-09-11 DIAGNOSIS — Z23 Encounter for immunization: Secondary | ICD-10-CM | POA: Diagnosis not present

## 2018-09-11 DIAGNOSIS — Z3689 Encounter for other specified antenatal screening: Secondary | ICD-10-CM | POA: Diagnosis not present

## 2018-09-11 DIAGNOSIS — Z3A29 29 weeks gestation of pregnancy: Secondary | ICD-10-CM | POA: Diagnosis not present

## 2018-09-21 DIAGNOSIS — O4443 Low lying placenta NOS or without hemorrhage, third trimester: Secondary | ICD-10-CM | POA: Diagnosis not present

## 2018-09-21 DIAGNOSIS — Z3A30 30 weeks gestation of pregnancy: Secondary | ICD-10-CM | POA: Diagnosis not present

## 2018-10-05 DIAGNOSIS — Z3483 Encounter for supervision of other normal pregnancy, third trimester: Secondary | ICD-10-CM | POA: Diagnosis not present

## 2018-10-19 DIAGNOSIS — O99210 Obesity complicating pregnancy, unspecified trimester: Secondary | ICD-10-CM | POA: Diagnosis not present

## 2018-10-19 DIAGNOSIS — Z3A34 34 weeks gestation of pregnancy: Secondary | ICD-10-CM | POA: Diagnosis not present

## 2018-10-19 DIAGNOSIS — Z3403 Encounter for supervision of normal first pregnancy, third trimester: Secondary | ICD-10-CM | POA: Diagnosis not present

## 2018-11-04 DIAGNOSIS — O99210 Obesity complicating pregnancy, unspecified trimester: Secondary | ICD-10-CM | POA: Diagnosis not present

## 2018-11-04 DIAGNOSIS — R030 Elevated blood-pressure reading, without diagnosis of hypertension: Secondary | ICD-10-CM | POA: Diagnosis not present

## 2018-11-04 DIAGNOSIS — Z3A36 36 weeks gestation of pregnancy: Secondary | ICD-10-CM | POA: Diagnosis not present

## 2018-11-04 DIAGNOSIS — Z3685 Encounter for antenatal screening for Streptococcus B: Secondary | ICD-10-CM | POA: Diagnosis not present

## 2018-11-04 LAB — OB RESULTS CONSOLE GBS: STREP GROUP B AG: NEGATIVE

## 2018-11-12 DIAGNOSIS — Z3A37 37 weeks gestation of pregnancy: Secondary | ICD-10-CM | POA: Diagnosis not present

## 2018-11-12 DIAGNOSIS — O99210 Obesity complicating pregnancy, unspecified trimester: Secondary | ICD-10-CM | POA: Diagnosis not present

## 2018-11-19 DIAGNOSIS — O99213 Obesity complicating pregnancy, third trimester: Secondary | ICD-10-CM | POA: Diagnosis not present

## 2018-11-19 DIAGNOSIS — Z3A38 38 weeks gestation of pregnancy: Secondary | ICD-10-CM | POA: Diagnosis not present

## 2018-11-23 ENCOUNTER — Other Ambulatory Visit: Payer: Self-pay

## 2018-11-23 ENCOUNTER — Encounter (HOSPITAL_COMMUNITY): Payer: Self-pay | Admitting: *Deleted

## 2018-11-23 ENCOUNTER — Inpatient Hospital Stay (HOSPITAL_COMMUNITY)
Admission: AD | Admit: 2018-11-23 | Discharge: 2018-11-27 | DRG: 788 | Disposition: A | Discharge status: Home / Self Care | Payer: BLUE CROSS/BLUE SHIELD | Attending: Obstetrics & Gynecology | Admitting: Obstetrics & Gynecology

## 2018-11-23 DIAGNOSIS — O99824 Streptococcus B carrier state complicating childbirth: Secondary | ICD-10-CM | POA: Diagnosis not present

## 2018-11-23 DIAGNOSIS — O99213 Obesity complicating pregnancy, third trimester: Secondary | ICD-10-CM | POA: Diagnosis not present

## 2018-11-23 DIAGNOSIS — O134 Gestational [pregnancy-induced] hypertension without significant proteinuria, complicating childbirth: Secondary | ICD-10-CM | POA: Diagnosis not present

## 2018-11-23 DIAGNOSIS — O34211 Maternal care for low transverse scar from previous cesarean delivery: Secondary | ICD-10-CM | POA: Diagnosis not present

## 2018-11-23 DIAGNOSIS — Z87891 Personal history of nicotine dependence: Secondary | ICD-10-CM

## 2018-11-23 DIAGNOSIS — O99892 Other specified diseases and conditions complicating childbirth: Secondary | ICD-10-CM | POA: Diagnosis present

## 2018-11-23 DIAGNOSIS — R030 Elevated blood-pressure reading, without diagnosis of hypertension: Secondary | ICD-10-CM | POA: Diagnosis not present

## 2018-11-23 DIAGNOSIS — O163 Unspecified maternal hypertension, third trimester: Secondary | ICD-10-CM | POA: Diagnosis present

## 2018-11-23 DIAGNOSIS — Z23 Encounter for immunization: Secondary | ICD-10-CM | POA: Diagnosis not present

## 2018-11-23 DIAGNOSIS — Z98891 History of uterine scar from previous surgery: Secondary | ICD-10-CM

## 2018-11-23 DIAGNOSIS — O99214 Obesity complicating childbirth: Secondary | ICD-10-CM | POA: Diagnosis not present

## 2018-11-23 DIAGNOSIS — O09523 Supervision of elderly multigravida, third trimester: Secondary | ICD-10-CM | POA: Diagnosis present

## 2018-11-23 DIAGNOSIS — Z3A39 39 weeks gestation of pregnancy: Secondary | ICD-10-CM

## 2018-11-23 DIAGNOSIS — Z3A00 Weeks of gestation of pregnancy not specified: Secondary | ICD-10-CM | POA: Diagnosis not present

## 2018-11-23 LAB — CBC
HCT: 39.4 % (ref 36.0–46.0)
Hemoglobin: 13 g/dL (ref 12.0–15.0)
MCH: 31.3 pg (ref 26.0–34.0)
MCHC: 33 g/dL (ref 30.0–36.0)
MCV: 94.9 fL (ref 80.0–100.0)
Platelets: 249 10*3/uL (ref 150–400)
RBC: 4.15 MIL/uL (ref 3.87–5.11)
RDW: 14.2 % (ref 11.5–15.5)
WBC: 11.2 10*3/uL — ABNORMAL HIGH (ref 4.0–10.5)
nRBC: 0 % (ref 0.0–0.2)

## 2018-11-23 LAB — COMPREHENSIVE METABOLIC PANEL
ALT: 24 U/L (ref 0–44)
AST: 26 U/L (ref 15–41)
Albumin: 2.7 g/dL — ABNORMAL LOW (ref 3.5–5.0)
Alkaline Phosphatase: 184 U/L — ABNORMAL HIGH (ref 38–126)
Anion gap: 8 (ref 5–15)
BUN: 7 mg/dL (ref 6–20)
CO2: 21 mmol/L — ABNORMAL LOW (ref 22–32)
Calcium: 9 mg/dL (ref 8.9–10.3)
Chloride: 107 mmol/L (ref 98–111)
Creatinine, Ser: 0.57 mg/dL (ref 0.44–1.00)
GFR calc Af Amer: >60 mL/min (ref >60)
GFR calc non Af Amer: >60 mL/min (ref >60)
Glucose, Bld: 84 mg/dL (ref 70–99)
Potassium: 4.1 mmol/L (ref 3.5–5.1)
Sodium: 136 mmol/L (ref 135–145)
Total Bilirubin: 0.3 mg/dL (ref 0.3–1.2)
Total Protein: 5.9 g/dL — ABNORMAL LOW (ref 6.5–8.1)

## 2018-11-23 LAB — TYPE AND SCREEN
ABO/RH(D): O POS
Antibody Screen: NEGATIVE

## 2018-11-23 MED ORDER — ACETAMINOPHEN 325 MG PO TABS: 650.0000 mg | ORAL_TABLET | ORAL | Status: DC | PRN

## 2018-11-23 MED ORDER — OXYCODONE-ACETAMINOPHEN 5-325 MG PO TABS: 1.0000 | ORAL_TABLET | ORAL | Status: DC | PRN

## 2018-11-23 MED ORDER — SOD CITRATE-CITRIC ACID 500-334 MG/5ML PO SOLN
30.0000 mL | ORAL | Status: DC | PRN
  Administered 2018-11-25: 30 mL via ORAL
  Filled 2018-11-23: qty 15

## 2018-11-23 MED ORDER — OXYCODONE-ACETAMINOPHEN 5-325 MG PO TABS: 2.0000 | ORAL_TABLET | ORAL | Status: DC | PRN

## 2018-11-23 MED ORDER — OXYTOCIN 40 UNITS IN NORMAL SALINE INFUSION - SIMPLE MED
1.0000 m[IU]/min | INTRAVENOUS | Status: DC
  Administered 2018-11-23: 6 m[IU]/min via INTRAVENOUS
  Administered 2018-11-23: 4 m[IU]/min via INTRAVENOUS
  Administered 2018-11-23: 2 m[IU]/min via INTRAVENOUS
  Filled 2018-11-23: qty 1000

## 2018-11-23 MED ORDER — LACTATED RINGERS IV SOLN
INTRAVENOUS | Status: DC
  Administered 2018-11-23 – 2018-11-25 (×5): via INTRAVENOUS

## 2018-11-23 MED ORDER — OXYTOCIN BOLUS FROM INFUSION: 500.0000 mL | Freq: Once | INTRAVENOUS | Status: DC

## 2018-11-23 MED ORDER — LIDOCAINE HCL (PF) 1 % IJ SOLN: 30.0000 mL | INTRAMUSCULAR | Status: DC | PRN

## 2018-11-23 MED ORDER — NALBUPHINE HCL 10 MG/ML IJ SOLN: 10.0000 mg | INTRAMUSCULAR | Status: DC | PRN

## 2018-11-23 MED ORDER — OXYTOCIN 40 UNITS IN NORMAL SALINE INFUSION - SIMPLE MED: 2.5000 [IU]/h | INTRAVENOUS | Status: DC

## 2018-11-23 MED ORDER — ONDANSETRON HCL 4 MG/2ML IJ SOLN: 4.0000 mg | Freq: Four times a day (QID) | INTRAMUSCULAR | Status: DC | PRN

## 2018-11-23 MED ORDER — LACTATED RINGERS IV SOLN
500.0000 mL | INTRAVENOUS | Status: DC | PRN
  Administered 2018-11-24: 500 mL via INTRAVENOUS
  Administered 2018-11-25: 1000 mL via INTRAVENOUS

## 2018-11-23 MED ORDER — TERBUTALINE SULFATE 1 MG/ML IJ SOLN: 0.2500 mg | Freq: Once | INTRAMUSCULAR | Status: DC | PRN

## 2018-11-23 NOTE — Anesthesia Pain Management Evaluation Note (Signed)
  CRNA Pain Management Visit Note  Patient: Carol Webb, 38 y.o., female  "Hello I am a member of the anesthesia team at [facility]. We have an anesthesia team available at all times to provide care throughout the hospital, including epidural management and anesthesia for C-section. I don't know your plan for the delivery whether it a natural birth, water birth, IV sedation, nitrous supplementation, doula or epidural, but we want to meet your pain goals."   1.Was your pain managed to your expectations on prior hospitalizations?   No prior hospitalizations  2.What is your expectation for pain management during this hospitalization?     Labor support without medications  3.How can we help you reach that goal? unsure  Record the patient's initial score and the patient's pain goal.   Pain: 0  Pain Goal: 10 [facility] wants you to be able to say your pain was always managed very well.  Cephus Shelling 11/23/2018

## 2018-11-23 NOTE — Progress Notes (Signed)
S:  Aware of ctx / some cramps  O:  VS: BP 135/86   Pulse 77   Temp 97.9 F (36.6 C) (Axillary)   Resp 16   Ht 5\' 7"  (1.702 m)   Wt 123.9 kg   LMP 02/20/2018   BMI 42.79 kg/m         FHR : baseline 135 / variability moderate / accelerations + / no decelerations        Toco: contractions every 3-5 minutes / mild x 60 sec / pitocin 6 mu/min        Cervix : very posterior/ 2 / 70 / vtx -3 not ballotable        Membranes: intact        Cervical balloon placed with some discomfort - inflated without difficulty with 60 ml and secured to leg  Results for [name], [name] (MRN [id number]) as of 11/23/2018 21:25  Ref. Range 11/23/2018 18:09  Creatinine Latest Ref Range: 0.44 - 1.00 mg/dL 4.92  Albumin Latest Ref Range: 3.5 - 5.0 g/dL 2.7 (L)  AST Latest Ref Range: 15 - 41 U/L 26  ALT Latest Ref Range: 0 - 44 U/L 24   A: induction of labor     FHR category 1  P: pitocin to 4 mu/min until balloon out     Balloon traction Q 2 hours until balloon out      encouraged to rest until labor becomes active                - encouraged IV med short acting to get some sleep               -declines but aware may ask nurse if desires   Marlinda Mike CNM, MSN, [facility] 11/23/2018, 9:23 PM

## 2018-11-23 NOTE — Progress Notes (Signed)
Pit titrated down to 4 miliunits/min per Marlinda Mike, CNM at bedside.

## 2018-11-23 NOTE — H&P (Signed)
OB ADMISSION/ HISTORY & PHYSICAL:  Admission Date: 11/23/2018  4:05 PM  Admit Diagnosis: 39.3 weeks with elevated blood pressure in office today - planned IOL this week  Carol Webb is a 38 y.o. female presenting for induction of labor  Prenatal History: G2P0010   EDC : 11/27/2018, by Other Basis  [facility]  Primary Ob Provider: CNM Prenatal course complicated by AMA / BMI > 30 / new onset hypertension  Prenatal Labs: ABO, Rh: --/--/O POS, O POS Performed at [facility], [address] [place], [place] [address]  [id number](06/03 0037) Antibody: NEG (06/03 0037) Rubella: Immune (07/08 0000)  RPR: Nonreactive (07/08 0000)  HBsAg: Negative (07/08 0000)  HIV: Non-reactive (07/08 0000)  GTT: NL GBS: Negative (01/08 0000)   OB History  Gravida Para Term Preterm AB Living  2       1 0  SAB TAB Ectopic Multiple Live Births  1            # Outcome Date GA Lbr Len/2nd Weight Sex Delivery Anes PTL Lv  2 Current           1 SAB 09/2017            Medical / Surgical History :  Past medical history:  Past Medical History:  Diagnosis Date  . Medical history non-contributory      Past surgical history:  Past Surgical History:  Procedure Laterality Date  . DIAGNOSTIC LAPAROSCOPY WITH REMOVAL OF ECTOPIC PREGNANCY N/A 03/30/2018   Procedure: DIAGNOSTIC LAPAROSCOPY WITH REMOVAL OF Ruptured Right Ovarian Cyst;  Surgeon: Olivia Mackieaavon, Richard, MD;  Location: [facility] ORS;  Service: Gynecology;  Laterality: N/A;  . NO PAST SURGERIES      Family History:  Family History  Problem Relation Age of Onset  . Colon cancer Father   . Prostate cancer Father      Social History:  reports that she quit smoking about 2 years ago. Her smoking use included cigarettes. She has never used smokeless tobacco. She reports current alcohol use. She reports current drug use. Drug: Marijuana.  Allergies: Patient has no known allergies.   Current Medications at time of  admission:  Prior to Admission medications   Medication Sig Start Date End Date Taking? Authorizing Provider  cholecalciferol (VITAMIN D) 1000 units tablet Take 1,000 Units by mouth daily.    [provider]  folic acid (FOLVITE) 800 MCG tablet Take 400 mcg by mouth daily.    [provider]  Multiple Vitamin (MULTIVITAMIN WITH MINERALS) TABS tablet Take 1 tablet by mouth daily.    [provider]   Review of Systems: Active FM Passed mucus plug with intermittent ctx this weekend - decided to wait to today visit to decide IOL plan Mild cramps and irregular ctx bloody show brown mucus  Physical Exam: VS: Blood pressure (!) 156/86, pulse 79, temperature 97.7 F (36.5 C), temperature source Oral, resp. rate 20, height 5\' 7"  (1.702 m), weight 123.9 kg, last menstrual period 02/20/2018.  General: alert and oriented, appears calm and comfortable / denies PIH symptoms Heart: RRR Lungs: Clear lung fields Abdomen: Gravid, soft and non-tender, non-distended / uterus: gravid Extremities: trace edema  Genitalia / VE: very posterior / 1-2 cm / 60% / -3  with ballotable vtx FHR: baseline rate 135 / variability moderate / accelerations + / no decelerations TOCO: UI  Assessment: 39.[redacted] weeks gestation AMA and increased BMI>30 New onset hypertension FHR category 1   Plan:  Admit Plan pitocin cervical balloon to facilitate labor Check PIH labs - discussed if labs normal can decide based on BP about water immersion / water birth                           - if PEC will not be candidate for water & will discuss options once active labor established  Dr Ernestina Patches notified of admission / plan of care   Marlinda Mike CNM, MSN, [facility] 11/23/2018, 5:57 PM

## 2018-11-24 ENCOUNTER — Encounter (HOSPITAL_COMMUNITY): Payer: Self-pay | Admitting: Anesthesiology

## 2018-11-24 ENCOUNTER — Inpatient Hospital Stay (HOSPITAL_COMMUNITY): Payer: BLUE CROSS/BLUE SHIELD | Admitting: Anesthesiology

## 2018-11-24 LAB — CBC
HCT: 36.7 % (ref 36.0–46.0)
Hemoglobin: 12 g/dL (ref 12.0–15.0)
MCH: 30.8 pg (ref 26.0–34.0)
MCHC: 32.7 g/dL (ref 30.0–36.0)
MCV: 94.1 fL (ref 80.0–100.0)
Platelets: 222 10*3/uL (ref 150–400)
RBC: 3.9 MIL/uL (ref 3.87–5.11)
RDW: 14.1 % (ref 11.5–15.5)
WBC: 13.1 10*3/uL — AB (ref 4.0–10.5)
nRBC: 0 % (ref 0.0–0.2)

## 2018-11-24 LAB — RPR: RPR Ser Ql: NONREACTIVE

## 2018-11-24 LAB — PROTEIN / CREATININE RATIO, URINE
Creatinine, Urine: 139 mg/dL
Protein Creatinine Ratio: 0.09 mg/mg{Cre} (ref 0.00–0.15)
Total Protein, Urine: 12 mg/dL

## 2018-11-24 MED ORDER — EPHEDRINE 5 MG/ML INJ: 10.0000 mg | INTRAVENOUS | Status: DC | PRN

## 2018-11-24 MED ORDER — LACTATED RINGERS IV SOLN
500.0000 mL | Freq: Once | INTRAVENOUS | Status: AC
  Administered 2018-11-24: 500 mL via INTRAVENOUS

## 2018-11-24 MED ORDER — FENTANYL 2.5 MCG/ML BUPIVACAINE 1/10 % EPIDURAL INFUSION (WH - ANES)
14.0000 mL/h | INTRAMUSCULAR | Status: DC | PRN
  Administered 2018-11-24 – 2018-11-25 (×2): 14 mL/h via EPIDURAL
  Filled 2018-11-24 (×3): qty 100

## 2018-11-24 MED ORDER — OXYTOCIN 40 UNITS IN NORMAL SALINE INFUSION - SIMPLE MED
1.0000 m[IU]/min | INTRAVENOUS | Status: DC
  Administered 2018-11-25 (×2): 6 m[IU]/min via INTRAVENOUS
  Administered 2018-11-25: 4 m[IU]/min via INTRAVENOUS

## 2018-11-24 MED ORDER — DIPHENHYDRAMINE HCL 50 MG/ML IJ SOLN: 12.5000 mg | INTRAMUSCULAR | Status: DC | PRN

## 2018-11-24 MED ORDER — LIDOCAINE HCL (PF) 1 % IJ SOLN
INTRAMUSCULAR | Status: DC | PRN
  Administered 2018-11-24: 5 mL via EPIDURAL
  Administered 2018-11-24: 7 mL via EPIDURAL

## 2018-11-24 MED ORDER — LACTATED RINGERS AMNIOINFUSION
INTRAVENOUS | Status: DC
  Administered 2018-11-24: 23:00:00 via INTRAUTERINE

## 2018-11-24 MED ORDER — PHENYLEPHRINE 40 MCG/ML (10ML) SYRINGE FOR IV PUSH (FOR BLOOD PRESSURE SUPPORT): 80.0000 ug | PREFILLED_SYRINGE | INTRAVENOUS | Status: DC | PRN

## 2018-11-24 MED ORDER — PHENYLEPHRINE 40 MCG/ML (10ML) SYRINGE FOR IV PUSH (FOR BLOOD PRESSURE SUPPORT)
80.0000 ug | PREFILLED_SYRINGE | INTRAVENOUS | Status: DC | PRN
  Filled 2018-11-24: qty 10

## 2018-11-24 NOTE — Progress Notes (Signed)
S:  Feeling well - states she can tell contractions are slightly more intense, but very irregular.  States it is sometimes 20 minutes in between contractions. Has been ambulating on unit. On 6 milliunits of Pitocin.  Pt. And husband states they are committed to Pitocin now question how long the process will take.  States they are willing to do whatever is necessary to get baby delivered. Denies HA, visual changes, RUQ/epigastric pain.   O:  VS: Blood pressure 130/80, pulse 84, temperature (!) 97.4 F (36.3 C), temperature source Axillary, resp. rate 18, height 5\' 7"  (1.702 m), weight 123.9 kg, last menstrual period 02/20/2018.  11/24/18 1037  -  84  -  18  130/80  -  -  -  - AS   11/24/18 1009  97.4 F (36.3 C)Abnormal   93  -  18  146/90Abnormal   -  -  -  - AS   11/24/18 0936  -  97  -  18  135/92Abnormal   -  -  -  - AS   11/24/18 0901  -  82  -  18  139/92Abnormal   -  -  -  - AS   11/24/18 0837  -  74  -  18  140/81  -  -  -  - AS   11/24/18 0805  -  88  -  18  142/86Abnormal   -  -  -  - AS   11/24/18 0732  98.7 F (37.1 C)  69  -  18  144/76Abnormal                      FHR : baseline 140 bpm / variability moderate / accelerations +15x15 / no decelerations        Toco: contractions are irregular with irritability, mild to palpation        Cervix : deferred        Membranes: AROM clear fluid   A: Latent labor      Induction of Labor for Gestational Hypertension      FHR category 1     GBS Negative   P: Increase Pitocin 2 milliunits x 2 milliunits until active labor pattern achieved      No neural s/s or evidence of PEC       Continue frequent position changes and ambulation       Reassess in 1-2 hours     Carlean Jews, MSN, CNM [facility]

## 2018-11-24 NOTE — Progress Notes (Signed)
S:  Requesting to be checked. Feeling more uncomfortable and considering hydrotherapy. Denies HA, visual changes, RUQ/epigastric pain  O:  VS: Blood pressure 132/79, pulse 70, temperature 99 F (37.2 C), temperature source Oral, resp. rate 18, height 5\' 7"  (1.702 m), weight 123.9 kg, last menstrual period 02/20/2018.  11/24/18 1723  -  70  -  18  132/79  -  -  -  - AS   11/24/18 1646  -  96  -  18  143/83Abnormal   -  -  -  - AS   11/24/18 1557  -  91  -  18  148/87Abnormal   -  -  -  - AS   11/24/18 1511  -  87  -  18  149/87Abnormal   -  -  -  - AS   11/24/18 1433  -  82  -  18  134/77  -  -  -  - AS   11/24/18 1408  -  70  -  18  145/90Abnormal   -                   FHR : baseline 140 bpm / variability moderate / accelerations +15x15 / occasional early decelerations        Toco: contractions every 2-3 minutes / moderate        Cervix : Dilation: 5.5 Effacement (%): 80 Cervical Position: Posterior Station: -1 Presentation: Vertex Exam by:: Sharyl Nimrod Amylia Collazos CNM  Caput noted; asynclitic         Membranes: Clear fluid   A: Transitioning to active labor     FHR category 1     GBS positive      GHTN  P: No neural s/s, labs stable    Continue frequent position changes- exaggerated sims with peanut ball      Discussed turning off Pitocin with trial of hydrotherapy - pt. Hesitant due to slow cervical changes     Discussed nitrous oxide vs. IV pain medication - wants to think about it     Reassess in 1-2 hours     Anticipate NSVD      Carlean Jews, MSN, CNM [facility]

## 2018-11-24 NOTE — Progress Notes (Signed)
S:  Pt. Requesting epidural - states "I have hit a wall and I'm exhausted."  Tried nitrous and didn't work well; declines IV pain medication. Denies HA, visual changes, RUQ/epigastric pain   O:  VS: Blood pressure 140/67, pulse 71, temperature 97.6 F (36.4 C), temperature source Oral, resp. rate 18, height 5\' 7"  (1.702 m), weight 123.9 kg, last menstrual period 02/20/2018.        FHR : baseline 140 bpm / variability moderate / accelerations +15x15 / no decelerations        Toco: contractions every 3 minutes per patient, no tracing well/moderate to palpation        Cervix : deferred        Membranes: clear fluid  A: Transitioning to active labor     FHR category 1     GBS positive      GHTN  P: No neural s/s, no evidence of PEC, labs stable, BPs mild range      Epidural now     Reassess cervical exam and possibly IUPC placement once comfortable     Anticipate NSVD  Dr. Juliene Pina updated with plan of care     Carlean Jews, MSN, [facility]

## 2018-11-24 NOTE — Progress Notes (Signed)
Called to room to evaluate FHR and elevated BP.  S:  Feeling some pressure with contractions. Denies HA, visual changes, RUQ/epigastric pain.   O:  VS: Blood pressure 133/80, pulse (!) 111, temperature 98.2 F (36.8 C), temperature source Oral, resp. rate 19, height 5\' 7"  (1.702 m), weight 123.9 kg, last menstrual period 02/20/2018, SpO2 98 %.  BP cuff was on left ankle/lower leg - likely not accurately taking. Recommended larger cuff on arm with improvement in BPs.         FHR : baseline 125-130bpm/ variability minimal to moderate / accelerations +15x15 / early decelerations with variable decelerations to nadir of 80-120bpm        Toco: contractions every 3-6 minutes / moderate / MVU - 80        Cervix : Dilation: Lip/rim Effacement (%): 90 Cervical Position: Posterior Station: 0 Presentation: Vertex Exam by:: Rose Phi, CNM  Cervix noted on right side from 6-12 o'clock        Membranes: clear/pink tinged fluid  A: Active labor     FHR category 2     GBS Negative     GHTN  P: FSE placed      Pitocin off, will give a 30 minute break.  Inadequate MVUS, will restart Pitocin at 6 milliunits/min.       S/p IVF bolus      Amnioinfusion bolus completed, now maintenance rate at 133mL/hr      Semi-fowlers position now with improvement in FHR      Continue to monitor closely     Working towards NSVD    Carlean Jews, MSN, CNM [facility]

## 2018-11-24 NOTE — Progress Notes (Signed)
S:  Comfortable with epidural, starting to feel some pressure with contractions. Denies HA, visual changes, RUQ/epigastric pain. On Pitocin 14 milliunits/min   O:  VS: Blood pressure (!) 156/72, pulse 92, temperature 98.8 F (37.1 C), temperature source Oral, resp. rate 17, height 5\' 7"  (1.702 m), weight 123.9 kg, last menstrual period 02/20/2018, SpO2 98 %.  11/24/18 2211  -  92  -  -  156/72Abnormal   -  98 %  -  - BP   11/24/18 2203  -  91  -  17  165/80Abnormal   -  -  -  - BP   11/24/18 2201  -  80  -  17  150/97Abnormal   -  -  -  - BP   11/24/18 2132  -  80  -  18  133/64                     FHR : baseline 135 bpm/ variability minimal to moderate / accelerations +10x10 and 15x15 / variable decelerations to nadir of 80-90bpm/ +scalp stimulation         Toco: contractions every 3 minutes / moderate        Cervix : Dilation: 8 Effacement (%): 90 Cervical Position: Posterior Station: 0 Presentation: Vertex Exam by:: Rose Phi, CNM  Some molding noted        Membranes: clear fluid   A: Active labor     FHR category 2     GBS negative      GHTN - no s/s of PEC  P: IUPC placed. Amnioinfusion initiated - bolus, then 161mL/hr     Continue position changes - right and left exaggerated SIMs with peanut ball      Working towards vaginal delivery   Dr. Juliene Pina updated with patient status and plan of care    Carlean Jews, MSN, CNM [facility]

## 2018-11-24 NOTE — Progress Notes (Signed)
I am assuming care of this patient and received report at 7:20am and was in the room at 7:45am.   S:  Pt. Reports doing well - was able to sleep some overnight.  Reports contractions have been mild since foley bulb out.  Per RN, foley out at 11pm last night.  Has been on Pitocin 4 milliunits all night. Denies HA, visual changes, RUQ/epigastric pain. Discussed AROM with patient and husband they agree.   O:  VS: Blood pressure (!) 142/86, pulse 88, temperature 98.7 F (37.1 C), temperature source Oral, resp. rate 18, height 5\' 7"  (1.702 m), weight 123.9 kg, last menstrual period 02/20/2018.  11/24/18 0805  -  88  -  18  142/86Abnormal   -  -  -  - AS   11/24/18 0732  98.7 F (37.1 C)  69  -  18  144/76Abnormal   -  -  -  - AS   11/24/18 0702  -  69  -  15  145/69Abnormal   -  -  -  - BP   11/24/18 0632  -  73  -  16  139/56Abnormal   -  -  -  - BP   11/24/18 0600  -  67  -  15  148/70Abnormal   -  -  -  - BP   11/24/18 0545  -  74  -  16  147/70Abnormal   -  -  -  - BP   11/24/18 0532  -  70  -  15  164/79Abnormal   -  -  -  - BP   11/24/18 0502  -  77  -  16  153/85Abnormal   -  -  -  - BP   11/24/18 0432  -  71  -  16  149/68Abnormal   -  -  -  - BP   11/24/18 0407  -  75  -  15  133/61  -  -  -  - BP   11/24/18 0332  -  69  -  15  158/66Abnormal   -  -  -  - BP   11/24/18 0325  98 F (36.7 C)  -  -  -  -  -  -  -  - BP   11/24/18 0302  -  67  -  17  149/72Abnormal   -  -  -  - BB   11/24/18 0232  -  77  -  15  135/75  -  -  -  - BP   11/24/18 0202  -  66  -  16  153/70Abnormal   -  -  -  - BP   11/24/18 0131  -  71  -  16  131/59Abnormal   -  -  -  - BP   11/24/18 0102  -  66  -  17  123/63  -  -  -  - BP   11/24/18 0032  -  78  -  16  114/67  -  -  -  - BP   11/24/18 0002  -  80  -  17  118/73                    FHR : baseline 135bpm / variability moderate / accelerations +15x15 / no decelerations        Toco: contractions are irregular -  difficult to trace at present; will continue  monitoring         Cervix : Dilation: 4.5 Effacement (%): 80 Cervical Position: Posterior Station: -2 Presentation: Vertex Exam by:: Carlean Jews CNM        Membranes: AROM- large amount of clear fluid   A: Latent labor      Induction of Labor for Gestational Hypertension      FHR category 1     GBS Negative   P: BPs mild range except for 1 severe range BP     No Neural s/s or evidence of PEC    Protein/creatinine ratio not done yesterday - will order this morning      Increase Pitocin to 6 milliunits     Desires natural labor, possible water immersion if candidate - discussed we will need to reassess BPs in active labor - pt. agrees     Reassess in 1-2 hrs     Carlean Jews, MSN, CNM [facility]

## 2018-11-24 NOTE — Progress Notes (Signed)
S:  Doing well; on Pitocin 10 milliunits. Can feel stronger contractions, but still tolerating well. Has been timing contractions every 3-7 minutes. Denies HA, visual changes, RUQ/epigastric pain   O:  VS: Blood pressure 138/87, pulse 78, temperature 98.5 F (36.9 C), temperature source Oral, resp. rate 16, height 5\' 7"  (1.702 m), weight 123.9 kg, last menstrual period 02/20/2018.  Today's Vitals   11/24/18 1106 11/24/18 1131 11/24/18 1158 11/24/18 1237  BP: (!) 143/91 140/87 (!) 146/88 138/87  Pulse: 86 90 78 78  Resp: 18 18 18 16   Temp:   98.5 F (36.9 C)   TempSrc:   Oral   Weight:      Height:      PainSc:   3            FHR : baseline 140 bpm / variability moderate / accelerations 15x15 / no decelerations        Toco: contractions every 3-7 minutes per patient / mild-moderate        Cervix : deferred        Membranes: clear fluid   A: Latentlabor Induction of Labor for Gestational Hypertension FHR category 1 GBS Negative   P: Continue Pitocin augmentation until adequate labor pattern      No neural s/s or evidence of PEC       Continue frequent position changes and ambulation       Reassess in 1-2 hours     Carlean Jews, MSN, CNM [facility]

## 2018-11-24 NOTE — Anesthesia Procedure Notes (Signed)
Epidural Patient location during procedure: OB Start time: 11/24/2018 6:43 PM End time: 11/24/2018 6:46 PM  Staffing Anesthesiologist: Leilani Able, MD  Preanesthetic Checklist Completed: patient identified, site marked, surgical consent, pre-op evaluation, timeout performed, IV checked, risks and benefits discussed and monitors and equipment checked  Epidural Patient position: sitting Prep: site prepped and draped and DuraPrep Patient monitoring: continuous pulse ox and blood pressure Approach: midline Location: L3-L4 Injection technique: LOR air  Needle:  Needle type: Tuohy  Needle gauge: 17 G Needle length: 9 cm and 9 Needle insertion depth: 6 cm Catheter type: closed end flexible Catheter size: 19 Gauge Catheter at skin depth: 11 cm Test dose: negative and Other  Assessment Sensory level: T9 Events: blood not aspirated, injection not painful, no injection resistance, negative IV test and no paresthesia  Additional Notes Reason for block:procedure for pain

## 2018-11-24 NOTE — Progress Notes (Signed)
S:  Pt. Comfortable with epidural. Happy with decision. Denies HA, visual changes, RUQ/epigastric pain.   O:  VS: Blood pressure 133/64, pulse 80, temperature 98.8 F (37.1 C), temperature source Oral, resp. rate 18, height 5\' 7"  (1.702 m), weight 123.9 kg, last menstrual period 02/20/2018.  11/24/18 2132  -  80  -  18  133/64  -  -  -  - BP   11/24/18 2101  -  78  -  18  133/80  -  -  -  - BP   11/24/18 2032  -  74  -  19  126/72  -  -  -  - BP   11/24/18 2001  -  91  -  18  139/80  -  -  -  - BP   11/24/18 1957  98.8 F (37.1 C)  -  -  -  -  -  -  -  - BP   11/24/18 1931  -  79  -  17  141/84Abnormal   -  -  -  - BP   11/24/18 1921  -  75  -  18  137/76  -  -  -  - AS   11/24/18 1917  -  86  -  18  148/84Abnormal   -  -  -  - AS   11/24/18 1911  -  84  -  18  140/86  -  -  -  - AS   11/24/18 1907  -  96  -  18  144/87Abnormal   -  -  -  - AS   11/24/18 1901  -  89  -  18  136/95Abnormal   -  -  -  - AS   11/24/18 1856  -  93  -  18  142/91Abnormal   -  -  -  - AS   11/24/18 1850  -  97  -  18  143/98Abnormal   -  -  -  - AS   11/24/18 1848  -  95  -  18  124/76  -  -  -  - AS   11/24/18 1806  -  71  -  18  140/67  -  -  -  - AS   11/24/18 1723  97.6 F (36.4 C)  70  -  18  132/79                     FHR : baseline 135 bpm / variability minimal to moderate / accelerations 15x15bpm / early decelerations with occasional variable        Toco: contractions every 1-3 minutes / moderate         Cervix : Dilation: 6.5 Effacement (%): 90 Cervical Position: Posterior Station: -1 Presentation: Vertex Exam by:: Rose PhiMeredith S, CNM        Membranes: Clear fluid  A: Activelabor FHR category 2 GBS negative GHTN - no evidence of PEC  P: Increase Pitocin to achieve adequate contraction pattern     Continue position changes - left and right exaggerated SIMs with peanut ball      Reassess in 1-2 hours       Anticipated MOD: working towards vaginal delivery     Carol JewsMeredith Axtyn Woehler,  MSN, CNM [facility]

## 2018-11-24 NOTE — Anesthesia Preprocedure Evaluation (Addendum)
Anesthesia Evaluation  Patient identified by MRN, date of birth, ID band Patient awake    Reviewed: Allergy & Precautions, H&P , Patient's Chart, lab work & pertinent test results  Airway Mallampati: II  TM Distance: >3 FB Neck ROM: full    Dental no notable dental hx. (+) Teeth Intact   Pulmonary former smoker,    Pulmonary exam normal breath sounds clear to auscultation       Cardiovascular negative cardio ROS Normal cardiovascular exam Rhythm:regular Rate:Normal     Neuro/Psych negative neurological ROS  negative psych ROS   GI/Hepatic negative GI ROS, Neg liver ROS,   Endo/Other  Morbid obesity  Renal/GU negative Renal ROS     Musculoskeletal   Abdominal (+) + obese,   Peds  Hematology negative hematology ROS (+)   Anesthesia Other Findings   Reproductive/Obstetrics (+) Pregnancy                             Anesthesia Physical Anesthesia Plan  ASA: III  Anesthesia Plan: Epidural   Post-op Pain Management:    Induction:   PONV Risk Score and Plan: Ondansetron, Dexamethasone and Scopolamine patch - Pre-op  Airway Management Planned: Nasal Cannula and Natural Airway  Additional Equipment:   Intra-op Plan:   Post-operative Plan:   Informed Consent: I have reviewed the patients History and Physical, chart, labs and discussed the procedure including the risks, benefits and alternatives for the proposed anesthesia with the patient or authorized representative who has indicated his/her understanding and acceptance.       Plan Discussed with: CRNA  Anesthesia Plan Comments: (For C/S with labor epidural)       Anesthesia Quick Evaluation

## 2018-11-24 NOTE — Progress Notes (Signed)
Late entry note: S:  Pt. Starting to get more uncomfortable and breathing through contractions. Pitocin at 14 milliunits. Denies HA, visual changes, RUQ/epigastric pain.   O:  VS: Blood pressure (!) 148/87, pulse 91, temperature 99 F (37.2 C), temperature source Oral, resp. rate 18, height 5\' 7"  (1.702 m), weight 123.9 kg, last menstrual period 02/20/2018.  11/24/18 1557  -  91  -  18  148/87Abnormal   -  -  -  - AS   11/24/18 1511  -  87  -  18  149/87Abnormal   -  -  -  - AS   11/24/18 1433  -  82  -  18  134/77  -  -  -  - AS   11/24/18 1408  -  70  -  18  145/90Abnormal   -  -  -  - AS   11/24/18 1340  99 F (37.2 C)  82  -  18  132/87  -  -  -  - AS   11/24/18 1309  -  76  -  18  146/97Abnormal   -                   FHR : baseline 125 bpm / variability moderate / accelerations +15x15 / no decelerations        Toco: contractions not tracing well on monitor due to habitus and position changes; per patient every 3-4 minutes; by my palpation mild-moderate        Cervix : declined exam        Membranes: clear fluid  A: Latentlabor with transition into active labor  Induction of Labor for Gestational Hypertension FHR category 1 GBS Negative  P:  No evidence of PEC; mild range BPs      Recommended hands/knees, standing, squatting positions or lateral positioning with the peanut to help facilitate rotation and descent     Interested in water immersion - advised will reassess when wanting pain relief options     Reasssess in 1-2 hrs      Anticipate NSVD    Carlean Jews, MSN, CNM [facility]

## 2018-11-25 ENCOUNTER — Encounter (HOSPITAL_COMMUNITY): Payer: Self-pay | Admitting: Obstetrics & Gynecology

## 2018-11-25 ENCOUNTER — Encounter (HOSPITAL_COMMUNITY)
Admission: AD | Disposition: A | Discharge status: Home / Self Care | Payer: Self-pay | Source: Home / Self Care | Attending: Obstetrics & Gynecology

## 2018-11-25 DIAGNOSIS — Z98891 History of uterine scar from previous surgery: Secondary | ICD-10-CM

## 2018-11-25 DIAGNOSIS — O99892 Other specified diseases and conditions complicating childbirth: Secondary | ICD-10-CM | POA: Diagnosis present

## 2018-11-25 LAB — CBC
HCT: 36.5 % (ref 36.0–46.0)
HEMOGLOBIN: 11.8 g/dL — AB (ref 12.0–15.0)
MCH: 30.3 pg (ref 26.0–34.0)
MCHC: 32.3 g/dL (ref 30.0–36.0)
MCV: 93.8 fL (ref 80.0–100.0)
Platelets: 217 10*3/uL (ref 150–400)
RBC: 3.89 MIL/uL (ref 3.87–5.11)
RDW: 14.3 % (ref 11.5–15.5)
WBC: 19.6 10*3/uL — ABNORMAL HIGH (ref 4.0–10.5)
nRBC: 0 % (ref 0.0–0.2)

## 2018-11-25 LAB — CORD BLOOD GAS (ARTERIAL)
Bicarbonate: 21.8 mmol/L (ref 20.0–28.0)
PCO2 CORD BLOOD: 55.2 mmHg (ref 42.0–56.0)
pH cord blood (arterial): 7.221 (ref 7.210–7.380)

## 2018-11-25 LAB — CREATININE, SERUM
Creatinine, Ser: 0.59 mg/dL (ref 0.44–1.00)
GFR calc Af Amer: >60 mL/min (ref >60)
GFR calc non Af Amer: >60 mL/min (ref >60)

## 2018-11-25 SURGERY — Surgical Case
Anesthesia: Epidural

## 2018-11-25 MED ORDER — OXYTOCIN 10 UNIT/ML IJ SOLN
INTRAVENOUS | Status: DC | PRN
  Administered 2018-11-25: 40 [IU] via INTRAVENOUS

## 2018-11-25 MED ORDER — NALBUPHINE HCL 10 MG/ML IJ SOLN: 5.0000 mg | INTRAMUSCULAR | Status: DC | PRN

## 2018-11-25 MED ORDER — LACTATED RINGERS IV SOLN
INTRAVENOUS | Status: DC
  Administered 2018-11-25: 10:00:00 via INTRAVENOUS

## 2018-11-25 MED ORDER — KETOROLAC TROMETHAMINE 30 MG/ML IJ SOLN
30.0000 mg | Freq: Once | INTRAMUSCULAR | Status: AC | PRN
  Administered 2018-11-25: 30 mg via INTRAVENOUS

## 2018-11-25 MED ORDER — MEPERIDINE HCL 25 MG/ML IJ SOLN
INTRAMUSCULAR | Status: AC
  Filled 2018-11-25: qty 1

## 2018-11-25 MED ORDER — ONDANSETRON HCL 4 MG/2ML IJ SOLN
INTRAMUSCULAR | Status: DC | PRN
  Administered 2018-11-25: 4 mg via INTRAVENOUS

## 2018-11-25 MED ORDER — ACETAMINOPHEN 500 MG PO TABS
1000.0000 mg | ORAL_TABLET | Freq: Four times a day (QID) | ORAL | Status: AC
  Administered 2018-11-25 – 2018-11-26 (×3): 1000 mg via ORAL
  Filled 2018-11-25 (×3): qty 2

## 2018-11-25 MED ORDER — DIPHENHYDRAMINE HCL 25 MG PO CAPS
25.0000 mg | ORAL_CAPSULE | ORAL | Status: DC | PRN
  Filled 2018-11-25: qty 1

## 2018-11-25 MED ORDER — LACTATED RINGERS IV SOLN
INTRAVENOUS | Status: DC | PRN
  Administered 2018-11-25: 04:00:00 via INTRAVENOUS

## 2018-11-25 MED ORDER — MEPERIDINE HCL 25 MG/ML IJ SOLN: 6.2500 mg | INTRAMUSCULAR | Status: DC | PRN

## 2018-11-25 MED ORDER — PROMETHAZINE HCL 25 MG/ML IJ SOLN: 6.2500 mg | INTRAMUSCULAR | Status: DC | PRN

## 2018-11-25 MED ORDER — OXYCODONE-ACETAMINOPHEN 5-325 MG PO TABS: 1.0000 | ORAL_TABLET | ORAL | Status: DC | PRN

## 2018-11-25 MED ORDER — DIPHENHYDRAMINE HCL 50 MG/ML IJ SOLN: 12.5000 mg | INTRAMUSCULAR | Status: DC | PRN

## 2018-11-25 MED ORDER — COCONUT OIL OIL: 1.0000 "application " | TOPICAL_OIL | Status: DC | PRN

## 2018-11-25 MED ORDER — OXYTOCIN 40 UNITS IN NORMAL SALINE INFUSION - SIMPLE MED: 2.5000 [IU]/h | INTRAVENOUS | Status: AC

## 2018-11-25 MED ORDER — PRENATAL MULTIVITAMIN CH
1.0000 | ORAL_TABLET | Freq: Every day | ORAL | Status: DC
  Administered 2018-11-25 – 2018-11-26 (×2): 1 via ORAL
  Filled 2018-11-25 (×2): qty 1

## 2018-11-25 MED ORDER — KETOROLAC TROMETHAMINE 30 MG/ML IJ SOLN
INTRAMUSCULAR | Status: AC
  Filled 2018-11-25: qty 1

## 2018-11-25 MED ORDER — DIBUCAINE 1 % RE OINT: 1.0000 "application " | TOPICAL_OINTMENT | RECTAL | Status: DC | PRN

## 2018-11-25 MED ORDER — NALOXONE HCL 0.4 MG/ML IJ SOLN: 0.4000 mg | INTRAMUSCULAR | Status: DC | PRN

## 2018-11-25 MED ORDER — ONDANSETRON HCL 4 MG/2ML IJ SOLN: 4.0000 mg | Freq: Three times a day (TID) | INTRAMUSCULAR | Status: DC | PRN

## 2018-11-25 MED ORDER — HYDROMORPHONE HCL 1 MG/ML IJ SOLN: 0.2500 mg | INTRAMUSCULAR | Status: DC | PRN

## 2018-11-25 MED ORDER — TETANUS-DIPHTH-ACELL PERTUSSIS 5-2.5-18.5 LF-MCG/0.5 IM SUSP: 0.5000 mL | Freq: Once | INTRAMUSCULAR | Status: DC

## 2018-11-25 MED ORDER — NALBUPHINE HCL 10 MG/ML IJ SOLN: 5.0000 mg | Freq: Once | INTRAMUSCULAR | Status: DC | PRN

## 2018-11-25 MED ORDER — SIMETHICONE 80 MG PO CHEW
80.0000 mg | CHEWABLE_TABLET | ORAL | Status: DC
  Administered 2018-11-26 (×2): 80 mg via ORAL
  Filled 2018-11-25 (×2): qty 1

## 2018-11-25 MED ORDER — SCOPOLAMINE 1 MG/3DAYS TD PT72
MEDICATED_PATCH | TRANSDERMAL | Status: AC
  Filled 2018-11-25: qty 1

## 2018-11-25 MED ORDER — DEXAMETHASONE SODIUM PHOSPHATE 10 MG/ML IJ SOLN
INTRAMUSCULAR | Status: DC | PRN
  Administered 2018-11-25: 10 mg via INTRAVENOUS

## 2018-11-25 MED ORDER — OXYTOCIN 10 UNIT/ML IJ SOLN
INTRAMUSCULAR | Status: AC
  Filled 2018-11-25: qty 4

## 2018-11-25 MED ORDER — DIPHENHYDRAMINE HCL 25 MG PO CAPS: 25.0000 mg | ORAL_CAPSULE | Freq: Four times a day (QID) | ORAL | Status: DC | PRN

## 2018-11-25 MED ORDER — MEPERIDINE HCL 25 MG/ML IJ SOLN
INTRAMUSCULAR | Status: DC | PRN
  Administered 2018-11-25 (×2): 12.5 mg via INTRAVENOUS

## 2018-11-25 MED ORDER — SODIUM BICARBONATE 8.4 % IV SOLN
INTRAVENOUS | Status: AC
  Filled 2018-11-25: qty 50

## 2018-11-25 MED ORDER — ACETAMINOPHEN 325 MG PO TABS
650.0000 mg | ORAL_TABLET | ORAL | Status: DC | PRN
  Administered 2018-11-26 (×3): 650 mg via ORAL
  Filled 2018-11-25 (×3): qty 2

## 2018-11-25 MED ORDER — KETOROLAC TROMETHAMINE 30 MG/ML IJ SOLN: 30.0000 mg | Freq: Four times a day (QID) | INTRAMUSCULAR | Status: AC | PRN

## 2018-11-25 MED ORDER — DEXTROSE 5 % IV SOLN
INTRAVENOUS | Status: AC
  Filled 2018-11-25: qty 3000

## 2018-11-25 MED ORDER — MENTHOL 3 MG MT LOZG: 1.0000 | LOZENGE | OROMUCOSAL | Status: DC | PRN

## 2018-11-25 MED ORDER — MORPHINE SULFATE (PF) 0.5 MG/ML IJ SOLN
INTRAMUSCULAR | Status: DC | PRN
  Administered 2018-11-25: 3 mg via EPIDURAL

## 2018-11-25 MED ORDER — WITCH HAZEL-GLYCERIN EX PADS: 1.0000 "application " | MEDICATED_PAD | CUTANEOUS | Status: DC | PRN

## 2018-11-25 MED ORDER — SIMETHICONE 80 MG PO CHEW
80.0000 mg | CHEWABLE_TABLET | Freq: Three times a day (TID) | ORAL | Status: DC
  Administered 2018-11-25 – 2018-11-26 (×5): 80 mg via ORAL
  Filled 2018-11-25 (×6): qty 1

## 2018-11-25 MED ORDER — ZOLPIDEM TARTRATE 5 MG PO TABS: 5.0000 mg | ORAL_TABLET | Freq: Every evening | ORAL | Status: DC | PRN

## 2018-11-25 MED ORDER — DEXTROSE 5 % IV SOLN
3.0000 g | INTRAVENOUS | Status: AC
  Administered 2018-11-25: 3 g via INTRAVENOUS
  Filled 2018-11-25: qty 3000

## 2018-11-25 MED ORDER — ONDANSETRON HCL 4 MG/2ML IJ SOLN
INTRAMUSCULAR | Status: AC
  Filled 2018-11-25: qty 2

## 2018-11-25 MED ORDER — LIDOCAINE-EPINEPHRINE (PF) 2 %-1:200000 IJ SOLN
INTRAMUSCULAR | Status: AC
  Filled 2018-11-25: qty 20

## 2018-11-25 MED ORDER — SODIUM BICARBONATE 8.4 % IV SOLN
INTRAVENOUS | Status: DC | PRN
  Administered 2018-11-25: 5 mL via EPIDURAL
  Administered 2018-11-25: 10 mL via EPIDURAL
  Administered 2018-11-25: 5 mL via EPIDURAL

## 2018-11-25 MED ORDER — PHENYLEPHRINE HCL 10 MG/ML IJ SOLN
INTRAMUSCULAR | Status: DC | PRN
  Administered 2018-11-25: 80 ug via INTRAVENOUS

## 2018-11-25 MED ORDER — IBUPROFEN 600 MG PO TABS
600.0000 mg | ORAL_TABLET | Freq: Four times a day (QID) | ORAL | Status: DC | PRN
  Administered 2018-11-26 – 2018-11-27 (×4): 600 mg via ORAL
  Filled 2018-11-25 (×5): qty 1

## 2018-11-25 MED ORDER — HEPARIN SODIUM (PORCINE) 5000 UNIT/ML IJ SOLN
5000.0000 [IU] | Freq: Three times a day (TID) | INTRAMUSCULAR | Status: DC
  Administered 2018-11-25 – 2018-11-27 (×6): 5000 [IU] via SUBCUTANEOUS
  Filled 2018-11-25 (×10): qty 1

## 2018-11-25 MED ORDER — MORPHINE SULFATE (PF) 0.5 MG/ML IJ SOLN
INTRAMUSCULAR | Status: AC
  Filled 2018-11-25: qty 10

## 2018-11-25 MED ORDER — DEXAMETHASONE SODIUM PHOSPHATE 10 MG/ML IJ SOLN
INTRAMUSCULAR | Status: AC
  Filled 2018-11-25: qty 1

## 2018-11-25 MED ORDER — SENNOSIDES-DOCUSATE SODIUM 8.6-50 MG PO TABS
2.0000 | ORAL_TABLET | ORAL | Status: DC
  Administered 2018-11-26 (×2): 2 via ORAL
  Filled 2018-11-25 (×2): qty 2

## 2018-11-25 MED ORDER — SIMETHICONE 80 MG PO CHEW: 80.0000 mg | CHEWABLE_TABLET | ORAL | Status: DC | PRN

## 2018-11-25 MED ORDER — SCOPOLAMINE 1 MG/3DAYS TD PT72
MEDICATED_PATCH | TRANSDERMAL | Status: DC | PRN
  Administered 2018-11-25: 1 via TRANSDERMAL

## 2018-11-25 MED ORDER — NALOXONE HCL 4 MG/10ML IJ SOLN
1.0000 ug/kg/h | INTRAVENOUS | Status: DC | PRN
  Filled 2018-11-25: qty 5

## 2018-11-25 MED ORDER — SODIUM CHLORIDE 0.9% FLUSH: 3.0000 mL | INTRAVENOUS | Status: DC | PRN

## 2018-11-25 MED ORDER — SCOPOLAMINE 1 MG/3DAYS TD PT72: 1.0000 | MEDICATED_PATCH | Freq: Once | TRANSDERMAL | Status: DC

## 2018-11-25 SURGICAL SUPPLY — 40 items
APL SKNCLS STERI-STRIP NONHPOA (GAUZE/BANDAGES/DRESSINGS) ×1
BENZOIN TINCTURE PRP APPL 2/3 (GAUZE/BANDAGES/DRESSINGS) ×1 IMPLANT
CHLORAPREP W/TINT 26ML (MISCELLANEOUS) ×2 IMPLANT
CLAMP CORD UMBIL (MISCELLANEOUS) IMPLANT
CLOTH BEACON ORANGE TIMEOUT ST (SAFETY) ×2 IMPLANT
DRSG OPSITE POSTOP 4X10 (GAUZE/BANDAGES/DRESSINGS) ×2 IMPLANT
ELECT REM PT RETURN 9FT ADLT (ELECTROSURGICAL) ×2
ELECTRODE REM PT RTRN 9FT ADLT (ELECTROSURGICAL) ×1 IMPLANT
EXTRACTOR VACUUM KIWI (MISCELLANEOUS) IMPLANT
EXTRACTOR VACUUM M CUP 4 TUBE (SUCTIONS) IMPLANT
GLOVE BIO SURGEON STRL SZ7 (GLOVE) ×2 IMPLANT
GLOVE BIOGEL PI IND STRL 7.0 (GLOVE) ×2 IMPLANT
GLOVE BIOGEL PI INDICATOR 7.0 (GLOVE) ×2
GOWN STRL REUS W/TWL LRG LVL3 (GOWN DISPOSABLE) ×4 IMPLANT
KIT ABG SYR 3ML LUER SLIP (SYRINGE) IMPLANT
NDL HYPO 25X5/8 SAFETYGLIDE (NEEDLE) IMPLANT
NEEDLE HYPO 25X5/8 SAFETYGLIDE (NEEDLE) ×2 IMPLANT
NS IRRIG 1000ML POUR BTL (IV SOLUTION) ×2 IMPLANT
PACK C SECTION WH (CUSTOM PROCEDURE TRAY) ×2 IMPLANT
PAD ABD 7.5X8 STRL (GAUZE/BANDAGES/DRESSINGS) ×1 IMPLANT
PAD OB MATERNITY 4.3X12.25 (PERSONAL CARE ITEMS) ×2 IMPLANT
RTRCTR C-SECT PINK 25CM LRG (MISCELLANEOUS) ×1 IMPLANT
SPONGE GAUZE 4X4 12PLY STER LF (GAUZE/BANDAGES/DRESSINGS) ×2 IMPLANT
STRIP CLOSURE SKIN 1/2X4 (GAUZE/BANDAGES/DRESSINGS) ×1 IMPLANT
SUT MNCRL 0 VIOLET CTX 36 (SUTURE) ×2 IMPLANT
SUT MONOCRYL 0 CTX 36 (SUTURE) ×3
SUT PLAIN 0 NONE (SUTURE) IMPLANT
SUT PLAIN 2 0 (SUTURE)
SUT PLAIN 2 0 XLH (SUTURE) ×1 IMPLANT
SUT PLAIN ABS 2-0 CT1 27XMFL (SUTURE) IMPLANT
SUT VIC AB 0 CT1 27 (SUTURE) ×4
SUT VIC AB 0 CT1 27XBRD ANBCTR (SUTURE) ×2 IMPLANT
SUT VIC AB 2-0 CT1 27 (SUTURE) ×2
SUT VIC AB 2-0 CT1 TAPERPNT 27 (SUTURE) ×1 IMPLANT
SUT VIC AB 4-0 KS 27 (SUTURE) ×2 IMPLANT
SUT VICRYL 0 TIES 12 18 (SUTURE) IMPLANT
SYR 3ML 25GX5/8 SAFETY (SYRINGE) ×1 IMPLANT
TOWEL OR 17X24 6PK STRL BLUE (TOWEL DISPOSABLE) ×2 IMPLANT
TRAY FOLEY W/BAG SLVR 14FR LF (SET/KITS/TRAYS/PACK) IMPLANT
WATER STERILE IRR 1000ML POUR (IV SOLUTION) ×1 IMPLANT

## 2018-11-25 NOTE — Transfer of Care (Signed)
Immediate Anesthesia Transfer of Care Note  Patient: Carol Webb  Procedure(s) Performed: CESAREAN SECTION (N/A )  Patient Location: PACU  Anesthesia Type:Epidural  Level of Consciousness: awake, alert , oriented and patient cooperative  Airway & Oxygen Therapy: Patient Spontanous Breathing  Post-op Assessment: Report given to RN and Post -op Vital signs reviewed and stable  Post vital signs: Reviewed and stable  Last Vitals:  Vitals Value Taken Time  BP    Temp    Pulse    Resp    SpO2      Last Pain:  Vitals:   11/25/18 0207  TempSrc:   PainSc: Asleep      Patients Stated Pain Goal: 1 (11/25/18 0036)  Complications: No apparent anesthesia complications

## 2018-11-25 NOTE — Progress Notes (Signed)
Notified by RN to review FHR at 1:12am for recurrent variable decelerations. Pitocin was turned off at 2315 with amnioinfusion going at for recurrent variables which resolved.  Pitocin was restarted at 6 milliunits  At 0013.  Variable decelerations noted after restart despite position changes, IVF bolus, and amnioinfusion.  S:  Pt. Comfortable with epidural. No pressure or pain. Denies HA, visual changes, RUQ/epigastric pain.   O:  VS: Blood pressure 128/67, pulse (!) 121, temperature 98.9 F (37.2 C), temperature source Oral, resp. rate 18, height 5\' 7"  (1.702 m), weight 123.9 kg, last menstrual period 02/20/2018, SpO2 98 %.        FHR : baseline 135 bpm / variability minimal to moderate / accelerations no / recurrent variable decelerations to nadir of 60-90 bpm.         Toco: contractions every 4-5 minutes / mild / MVU - inadequate 20-40 MVUs        Cervix : Dilation: Lip/rim Effacement (%): 90 Cervical Position: Posterior Station: Plus 1 Presentation: Vertex Exam by:: Rose Phi, CNM  Rim unable to be reduced         Membranes: clear fluid   A: Protracted active labor      FHR Category 2     GBS Negative       IOL for GHTN - no evidence of PEC, no neural s/s  P: Pitocin off at 0146am.      Amnioinfusion continuing at 136mL/hr     Exaggerated SIMs position with peanut ball    Discussed findings with patient - unable to achieve adequate contractions for cervical change due to FHR     Anticipated MOD: unclear  Dr. Juliene Pina notified and reviewed fetal monitoring strip. Orders received as above.     Carlean Jews, MSN, CNM [facility]

## 2018-11-25 NOTE — Lactation Note (Signed)
This note was copied from a baby's chart. Lactation Consultation Note  Patient Name: Carol Webb Today's Date: 11/25/2018 Reason for consult: Follow-up assessment;Mother's request;Difficult latch  P1, 16 hour female infant. Parents requesting latch assistance. LC noticed mom has large flat nipples. LC discussed breast stimulation with hand expression, pre-pumping prior to latching infant to breast. Mom fitted with size 24 NS and explained how to use. Mom latched infant on left breast using football hold with NS, infant started slow with suckling but eventually developed a rthymitic pattern, with few swallows observed.  Infant breastfeed for 15 minutes. Mom was taught hand expression and mom gave back volume 9 ml of EBM on a spoon. Mom was given a DEBP and mom knows to pump every 3 hours on initial setting for 15 minutes. Mom shown how to use DEBP & how to disassemble, clean, & reassemble parts. Mom knows to call Nurse or LC if she has any questions, concerns or needs assistance with latching infant to breast.  Maternal Data    Feeding Feeding Type: Breast Fed  LATCH Score Latch: Grasps breast easily, tongue down, lips flanged, rhythmical sucking.  Audible Swallowing: A few with stimulation  Type of Nipple: Flat  Comfort (Breast/Nipple): Soft / non-tender  Hold (Positioning): Assistance needed to correctly position infant at breast and maintain latch.  LATCH Score: 7  Interventions Interventions: Assisted with latch;Skin to skin;Adjust position;DEBP;Hand pump;Pre-pump if needed;Breast compression;Hand express;Breast massage  Lactation Tools Discussed/Used Pump Review: Setup, frequency, and cleaning;Milk Storage;Other (comment) Initiated by:: Carol Webb, IBCLC Date initiated:: 11/25/18   Consult Status Consult Status: Follow-up Date: 11/26/18 Follow-up type: In-patient    Carol Webb 11/25/2018, 8:41 PM

## 2018-11-25 NOTE — Progress Notes (Addendum)
Rose Phi, CNM at bedside. Verbal order to restart pitocin at 6 mn/min in .

## 2018-11-25 NOTE — Progress Notes (Signed)
Patient ID: Carol Webb, female   DOB: [birth year], 38 y.o.   MRN: [id number] Pt assessed.  9.5 cm since 11.15 pm. Pitocin turned off intermittently due to fetal intolerance to labor. Amnioinfusion ongoing since variable decels started, tried exaggerated Sims, peanut ball, hands and knees with no change in FHT- variable decels down to 60-70 and not being able to tolerate contractions when pitocin restarted.  Reviewed concerns and recommend C-section for fetal intolerance to labor and inability to have effective labor pattern with pitocin and therefore likely arrest of dilatation.  Risks/complications of surgery reviewed incl infection, bleeding, damage to internal organs including bladder, bowels, ureters, blood vessels, other risks from anesthesia, VTE and delayed complications of any surgery, complications in future surgery reviewed. Also discussed neonatal complications incl difficult delivery, laceration, vacuum assistance, TTN etc. Pt understands and agrees, all concerns addressed.    V.ModyMD

## 2018-11-25 NOTE — Progress Notes (Signed)
POSTOPERATIVE DAY # 0 S/P Primary LTCS for fetal intolerance to labor and arrest of dilation, baby boy "Ernie"   S:         Reports feeling well, no complaints. Happy with overall experience   Denies HA, visual changes, RUQ/epigastric pain              Tolerating po intake / no nausea / no vomiting / + flatus / no BM  Denies dizziness, SOB, or CP             Pain well controlled             Up with assistance/ foley catheter in place  Newborn breast feeding - reports baby is sleepy but has tried to latch  / Circumcision - declined    O:  VS: BP 125/73   Pulse 62   Temp 98.1 F (36.7 C)   Resp 16   Ht 5\' 7"  (1.702 m)   Wt 123.9 kg   LMP 02/20/2018   SpO2 98%   BMI 42.79 kg/m    LABS:               Recent Labs    11/24/18 1819 11/25/18 0652  WBC 13.1* 19.6*  HGB 12.0 11.8*  PLT 222 217               Bloodtype: --/--/O POS (01/27 1809)  Rubella: Immune (07/08 0000)                                             I&O: Intake/Output      01/28 0701 - 01/29 0700 01/29 0701 - 01/30 0700   P.O.  180   I.V. (mL/kg) 2300 (18.6) 190 (1.5)   Total Intake(mL/kg) 2300 (18.6) 370 (3)   Urine (mL/kg/hr) 625 (0.2)    Blood 320    Total Output 945    Net +1355 +370                     Physical Exam:             Alert and Oriented X3  Exam deferred as extended family in room  Extremities: +1 BLE edema, non-pitting; SCDs in place  A/P:     POD # 0 S/P Primary LTCS            Gestational Hypertension    - BPs stable since delivery   - No evidence of PEC   - D/C IVF as patient has already received several liters of fluid in surgery and is net positive   Routine postoperative care   D/C foley catheter              See lactation  Continue current care   Carlean Jews, MSN, CNM [facility]

## 2018-11-25 NOTE — Progress Notes (Signed)
I called Dr. Juliene Pina to assess at bedside FHR and vaginal exam at 0232am.  S:  Comfortable with epidural   O:  VS: Blood pressure 119/78, pulse 89, temperature 98.9 F (37.2 C), temperature source Oral, resp. rate 18, height 5\' 7"  (1.702 m), weight 123.9 kg, last menstrual period 02/20/2018, SpO2 100 %.        FHR : baseline change to 145bpm to 150 bpm / variability moderate / accelerations no / variable decelerations        Toco: contractions every 5 minutes / mild/ MVUs inadequate        Cervix : Dilation: Lip/rim Effacement (%): 90 Cervical Position: Posterior Station: Plus 1 Presentation: Vertex Exam by:: Dr. Juliene Pina        Membranes: clear fluid  A: Protracted active labor     FHR category 2     GBS Negative     GHTN - BPs stable   P: Pt. Assisted to hands/knees position. Pitocin restarted at 4 milliunits for trial of contractions.  FHR showed continued recurrent variable decelerations with contractions to nadir of 80bpm. Discussed unable to achieve adequate cervical change and descent without contractions, and with contractions we are having recurrent variable. Discussion for primary LTCS discussed.  Consented, risks, discussed by Dr. Juliene Pina. Pt. And family agree with plan.     Carol Jews, MSN, CNM [facility]

## 2018-11-25 NOTE — Progress Notes (Signed)
Dr. Juliene Pina and Rose Phi, CNM at bedside discussing risks and benefits for primary cesarean section.

## 2018-11-25 NOTE — Op Note (Signed)
Cesarean Section Procedure Note   Carol Webb 11/25/2018  Procedure: Primary low transverse c-section   Indications: Fetal intolerance to labor, arrest of dilatation. Induction for gestational HTN   Pre-operative Diagnosis: Unscheduled primary cesarean section; fetal intolerance to labor; arrest dilation.   Post-operative Diagnosis: Same   Surgeon: Shea EvansMody, Elga Santy, MD   Assistants: Carlean JewsMeredith Sigmon, CNM  Anesthesia: Epidural   Procedure Details:  The patient was seen in the Labor Room. The risks, benefits, complications, treatment options, and expected outcomes were discussed with the patient. The patient concurred with the proposed plan, giving informed consent. identified as Carol Webb and the procedure verified as C-Section Delivery. A Time Out was held and the above information confirmed. 3 gm Ancef given in OR before incision.  After induction of anesthesia, the patient was draped and prepped in the usual sterile manner, foley was draining urine well.  A pfannenstiel incision was made and carried down through the subcutaneous tissue to the fascia. Fascial incision was made and extended transversely. The fascia was separated from the underlying rectus tissue superiorly and inferiorly. The peritoneum was identified and entered. Peritoneal incision was extended longitudinally. Alexis-O retractor placed. The utero-vesical peritoneal reflection was incised transversely and the bladder flap was bluntly freed from the lower uterine segment. A low transverse uterine incision was made. Delivered from cephalic presentation was a FEMALE infant with vigorous cry. Apgar scores of 9 at one minute and 9 at five minutes. Delayed cord clamping done at 30 seconds as baby appeared a bit stunned and limp and baby handed to NICU team in attendance. Cord ph was sent. Cord blood was obtained for evaluation. The placenta was removed Intact and appeared normal. The uterine outline noted extension of hysterotomy 3  cm from right angle towards cervix. Both tubes and ovaries appeared normal. The uterine extension was closed first with 0-Monocryl. Then the incision was closed with running locked sutures of 0Monocryl. A second imbricating layer sutured. Hemostasis was observed. Alexis retractor removed. Peritoneal closure done with 2-0 Vicryl.  The fascia was then reapproximated with running sutures of 0Vicryl. The subcuticular closure was performed using 2-0plain gut. The skin was closed with 4-0Vicryl.  Steristrips and pressure dressing and honeycomb dressing placed.   Instrument, sponge, and needle counts were correct prior the abdominal closure and were correct at the conclusion of the case.   Findings: Direct OP position noted at hysterotomy. No nuchal cord but a loop of cord was noted in front of the chest. Likely mid pelvic arrest. Baby came out stunned but cried with stimulation and Apgars 9, 9. Cord gas sent- pending report.    Estimated Blood Loss: 270 mL   Total IV Fluids: 2000 ml LR   Urine Output: 200CC OF clear urine  Specimens: cord blood, cord gas  Complications: no complications  Disposition: PACU - hemodynamically stable.   Maternal Condition: stable   Baby condition / location:  Couplet care / Skin to Skin  Attending Attestation: I performed the procedure.   Signed: Surgeon(s): Shea EvansMody, Drusilla Wampole, MD

## 2018-11-25 NOTE — Lactation Note (Signed)
This note was copied from a baby's chart. Lactation Consultation Note  Patient Name: Carol Webb Today's Date: 11/25/2018 Reason for consult: Initial assessment;Term;Primapara;1st time breastfeeding  P1 mother whose infant is now 44 hours old.  Baby was doing STS with father when I arrived and not showing feeding cues.  Mother has been attempting feedings but states that baby has been very sleepy.  Reassured mother that this is typical behavior for a baby at this age.  Encouraged to feed 8-12 times/24 hours or sooner if baby shows feeding cues.  Mother is familiar with feeding cues and hand expression.  Colostrum container provided for any EBM she may obtain with hand expression.  Milk storage times reviewed and finger feeding demonstrated.  Mom made aware of O/P services, breastfeeding support groups, community resources, and our phone # for post-discharge questions.  Mother will return to work in 12 weeks and has a [employer] pump for home use.  She will call for any questions/concerns or latch assistance as needed.   Maternal Data Formula Feeding for Exclusion: No Has patient been taught Hand Expression?: Yes Does the patient have breastfeeding experience prior to this delivery?: No  Feeding    LATCH Score                   Interventions    Lactation Tools Discussed/Used WIC Program: No   Consult Status Consult Status: Follow-up Date: 11/26/18 Follow-up type: In-patient    Sameera Betton R Daman Steffenhagen 11/25/2018, 11:20 AM

## 2018-11-25 NOTE — Anesthesia Postprocedure Evaluation (Signed)
Anesthesia Post Note  Patient: Carol Webb  Procedure(s) Performed: CESAREAN SECTION (N/A )     Patient location during evaluation: Mother Baby Anesthesia Type: Epidural Level of consciousness: awake, awake and alert and oriented Pain management: pain level controlled Vital Signs Assessment: post-procedure vital signs reviewed and stable Respiratory status: spontaneous breathing and respiratory function stable Cardiovascular status: blood pressure returned to baseline Postop Assessment: no headache, no backache, epidural receding, patient able to bend at knees, no apparent nausea or vomiting, adequate PO intake and able to ambulate Anesthetic complications: no    Last Vitals:  Vitals:   11/25/18 0630 11/25/18 0715  BP: 120/71 123/72  Pulse: 76 62  Resp: 18 18  Temp: 36.5 C 37.2 C  SpO2: 95% 96%    Last Pain:  Vitals:   11/25/18 0820  TempSrc:   PainSc: Asleep   Pain Goal: Patients Stated Pain Goal: 1 (11/25/18 0036)                 Cleda Clarks

## 2018-11-26 NOTE — Progress Notes (Signed)
CSW received consult for hx of marijuana use.  Referral was screened out due to the following: ~MOB had no documented substance use after initial prenatal visit/+UPT. ~MOB had no positive drug screens after initial prenatal visit/+UPT.  Please consult CSW if current concerns arise or by MOB's request.  CSW will monitor CDS results and make report to Child Protective Services if warranted.  Mykah Shin Boyd-Gilyard, MSW, LCSW Clinical Social Work [phone]  

## 2018-11-26 NOTE — Progress Notes (Signed)
POD#1  S: Pt notes pain controlled w/ po meds, minimal lochia, nl void, out of bed w/o dizziness or chest pain, tol reg po, + flatus. Pt is  Breastfeeding.  No headache, no vision change  Vitals:   11/25/18 1745 11/25/18 2145 11/26/18 0100 11/26/18 0520  BP: 109/73 100/65 122/72 117/70  Pulse: (!) 102 70 80 65  Resp: 18 18 20 16   Temp: 98.7 F (37.1 C) 98.6 F (37 C) 97.8 F (36.6 C) 98 F (36.7 C)  TempSrc: Oral Oral Oral Oral  SpO2: 100%     Weight:      Height:        Gen: well appearing CV: RRR Pulm: CTAB Abd: soft, ND, approp tender, fundus below umbilicus, NT Inc: Well approximated, dressing saturated LE: tr edema, NT  CBC    Component Value Date/Time   WBC 19.6 (H) 11/25/2018 0652   RBC 3.89 11/25/2018 0652   HGB 11.8 (L) 11/25/2018 0652   HCT 36.5 11/25/2018 0652   PLT 217 11/25/2018 0652   MCV 93.8 11/25/2018 0652   MCH 30.3 11/25/2018 0652   MCHC 32.3 11/25/2018 0652   RDW 14.3 11/25/2018 0652   LYMPHSABS 1.6 06/15/2014 1144   MONOABS 0.6 06/15/2014 1144   EOSABS 0.1 06/15/2014 1144   BASOSABS 0.0 06/15/2014 1144    A/P: POD#1 s/p primary cesarean section for arrest of descent and fetal intolerance to labor - post-op. Doing well.  -Arrest of descent.  Intraoperative findings of narrow mid pelvis discussed with patient recommended for repeat cesarean section in the future -Induction of labor for hypertension.  No evidence of preeclampsia.  Blood pressures normal since delivery   Lendon Colonel 11/26/2018 12:39 PM

## 2018-11-27 ENCOUNTER — Encounter (HOSPITAL_COMMUNITY): Payer: Self-pay | Admitting: *Deleted

## 2018-11-27 MED ORDER — IBUPROFEN 600 MG PO TABS: 600.0000 mg | ORAL_TABLET | Freq: Four times a day (QID) | ORAL | 0 refills | Status: DC | PRN

## 2018-11-27 MED ORDER — OXYCODONE-ACETAMINOPHEN 5-325 MG PO TABS: 1.0000 | ORAL_TABLET | Freq: Four times a day (QID) | ORAL | 0 refills | Status: DC | PRN

## 2018-11-27 NOTE — Discharge Instructions (Signed)
°  TAKE DAILY BABY ASA x 6 weeks  Walk daily every 4 hours while awake

## 2018-11-27 NOTE — Progress Notes (Signed)
POSTOPERATIVE DAY # 2 S/P CS - arrest of labor   S:         Reports feeling well - wants to go home             Tolerating po intake / no nausea / no vomiting / + flatus / no BM             Bleeding is light             Pain controlled with Motrin             Up ad lib / ambulatory/ voiding QS  Newborn Breast  O:  VS: BP 135/89 (BP Location: Left Arm)   Pulse (!) 54   Temp 98.2 F (36.8 C) (Oral)   Resp 18   Ht 5\' 7"  (1.702 m)   Wt 123.9 kg   LMP 02/20/2018   SpO2 100%   Breastfeeding Unknown   BMI 42.79 kg/m    LABS:              Recent Labs    11/24/18 1819 11/25/18 0652  WBC 13.1* 19.6*  HGB 12.0 11.8*  PLT 222 217               Bloodtype: --/--/O POS (01/27 1809)  Rubella: Immune (07/08 0000)                                Physical Exam:             Alert and Oriented X3  Lungs: Clear and unlabored  Heart: regular rate and rhythm / no mumurs  Abdomen: soft, non-tender,large panus without errythema, non-distended, active BS             Fundus: firm, non-tender, Ueven             Dressing intact              Incision:  approximated with suture / no erythema / no ecchymosis / no drainage  Perineum: intact  Lochia: light  Extremities: trace edema, no calf pain or tenderness, negative Homans  A:        POD # 2 S/P CS           obesity  P:        Routine postoperative care              Activity / post-op instructions reviewed             Plan OV for BP recheck and wound check next 10 days             DC home   Marlinda Mike CNM, MSN, [facility] 11/27/2018, 9:12 AM

## 2018-11-27 NOTE — Discharge Summary (Signed)
OB Discharge Summary  Patient Name: Carol Webb DOB: August 31, 1981 MRN: [id number]  Date of admission: 11/23/2018  Admitting diagnosis: gestational hypertension Intrauterine pregnancy: [redacted]w[redacted]d     Secondary diagnosis: Obesity   Date of discharge: 11/27/2018    Discharge diagnosis: Term Pregnancy Delivered / POD # 2 s/p CS for arrest of active labor and NRFHR   Prenatal history: G2P0010   EDC : 11/27/2018, by Other Basis  Prenatal care at [facility]  Primary provider : CNM Prenatal course complicated by obesity, AMA, gestational hypertension  Prenatal Labs: ABO, Rh: --/--/O POS (01/27 1809)  Antibody: NEG (01/27 1809) Rubella: Immune (07/08 0000)  RPR: Non Reactive (01/27 1809)  HBsAg: Negative (07/08 0000)  HIV: Non-reactive (07/08 0000)  GBS: Negative (01/08 0000)                                    Hospital course:  Induction of Labor With Cesarean Section  38 y.o. yo G2P0010 at [redacted]w[redacted]d was admitted to the hospital 11/23/2018 for induction of labor. Patient had a labor course significant for arrest of active labor and NRFHR in active labor with repetitive variable and late and prolonged decels in FHR- arrested dilation to 9+cm +1 station. The patient went for cesarean section due to Non-Reassuring FHR, and delivered a Viable infant,11/25/2018  Membrane Rupture Time/Date: 7:58 AM ,11/24/2018   Details of operation can be found in separate operative Note.    Patient had an uncomplicated postpartum course. She is ambulating, tolerating a regular diet, passing flatus, and urinating well.  Patient is discharged home in stable condition on 11/29/18.                                   Augmentation: pitocin Delivering PROVIDER: MODY, VAISHALI                                                            Complications: None  Newborn Data: Live born female  Birth Weight: 7 lb 11.1 oz (3490 g) APGAR: 8, 9  Newborn Delivery   Birth date/time:  11/25/2018  03:51:00 Delivery type:  C-Section, Low Vertical     Baby Feeding: Breast Disposition:home with mother  Post partum procedures:none  Labs: Lab Results  Component Value Date   WBC 19.6 (H) 11/25/2018   HGB 11.8 (L) 11/25/2018   HCT 36.5 11/25/2018   MCV 93.8 11/25/2018   PLT 217 11/25/2018   CMP Latest Ref Rng & Units 11/25/2018  Glucose 70 - 99 mg/dL -  BUN 6 - 20 mg/dL -  Creatinine 2.86 - 3.81 mg/dL 7.71  Sodium 165 - 790 mmol/L -  Potassium 3.5 - 5.1 mmol/L -  Chloride 98 - 111 mmol/L -  CO2 22 - 32 mmol/L -  Calcium 8.9 - 10.3 mg/dL -  Total Protein 6.5 - 8.1 g/dL -  Total Bilirubin 0.3 - 1.2 mg/dL -  Alkaline Phos 38 - 383 U/L -  AST 15 - 41 U/L -  ALT 0 - 44 U/L -      Physical Exam @ time of discharge:  Vitals:  11/26/18 0100 11/26/18 0520 11/26/18 2238 11/27/18 0525  BP: 122/72 117/70 129/81 135/89  Pulse: 80 65 70 (!) 54  Resp: 20 16 16 18   Temp: 97.8 F (36.6 C) 98 F (36.7 C) 97.7 F (36.5 C) 98.2 F (36.8 C)  TempSrc: Oral Oral Oral Oral  SpO2:   99% 100%  Weight:      Height:        General: alert, cooperative and no distress Lochia: appropriate Uterine Fundus: firm Perineum: intact Incision: Healing well with no significant drainage Extremities: DVT Evaluation: No evidence of DVT seen on physical exam.   Discharge instructions:  "Baby and Me Booklet" and [facility] Booklet  Discharge Medications:  Prenatal vitamin Motrin 800mg  Q8 huors Oxycodone 5mg  Q6-8 hrs PRN pain Daily ASA  Diet: routine diet  Activity: Advance as tolerated. Pelvic rest x 6 weeks.   Follow up:2 weeks    Signed: Marlinda Mikeanya Bailey CNM, MSN, [facility] 11/27/2018, 9:57 AM

## 2018-12-02 DIAGNOSIS — R030 Elevated blood-pressure reading, without diagnosis of hypertension: Secondary | ICD-10-CM | POA: Diagnosis not present

## 2018-12-19 IMAGING — US US ABDOMEN LIMITED
1 series · 14 of 25 positions shown · non-contrast
Comparison: None.

CLINICAL DATA: 36-year-old female with history of abdominal pain
this morning. Positive pregnancy test.

EXAM:
ULTRASOUND ABDOMEN LIMITED RIGHT UPPER QUADRANT

[Series 1: us abdomen limited · 0.30mm/px · 14 of 44 slices shown]
[im 1/44]
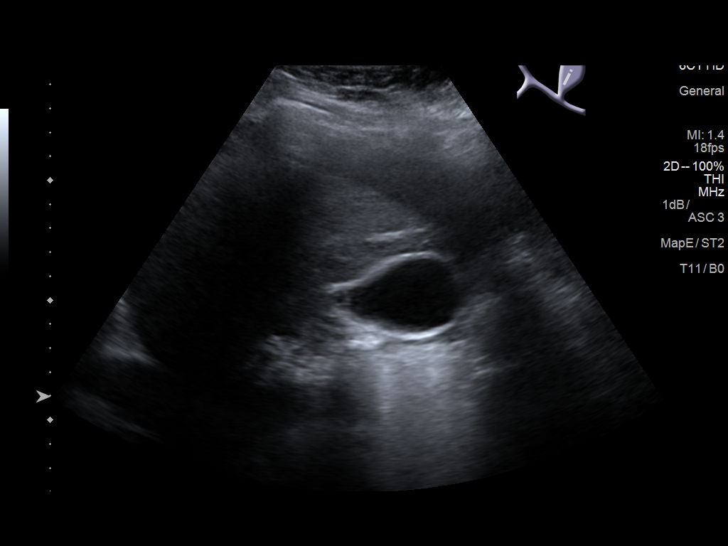
[im 4/44]
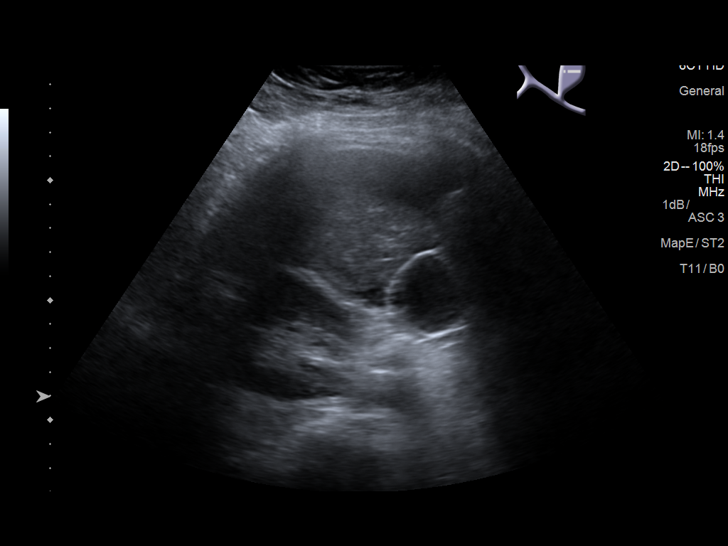
[im 8/44]
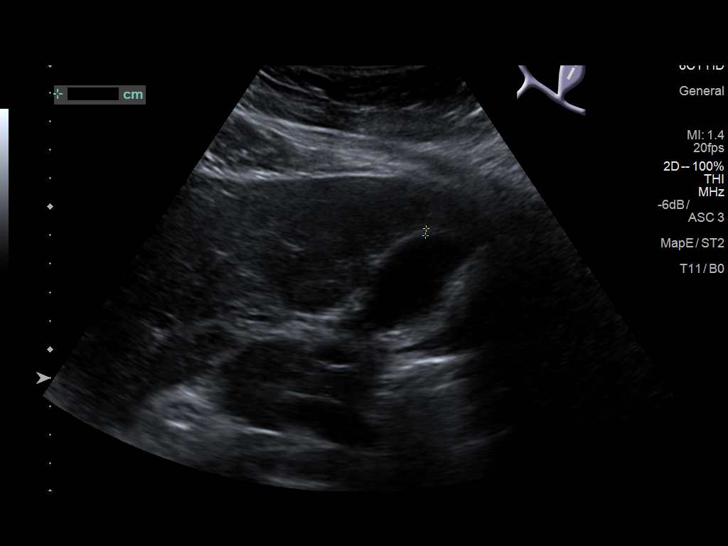
[im 11/44]
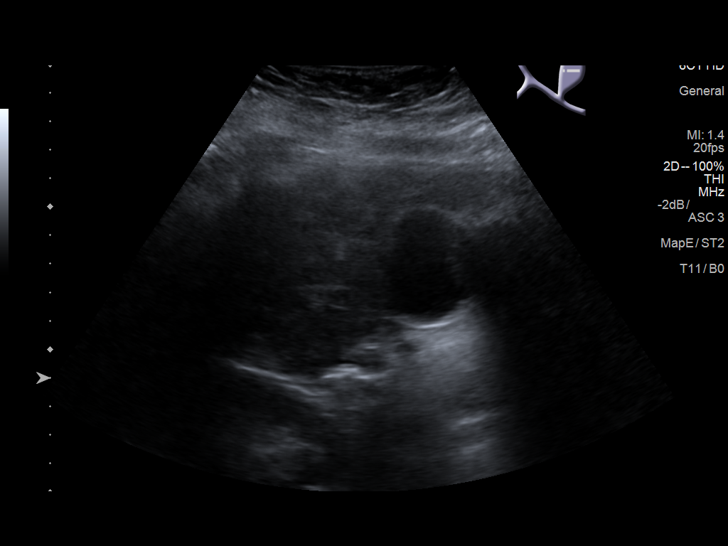
[im 15/44]
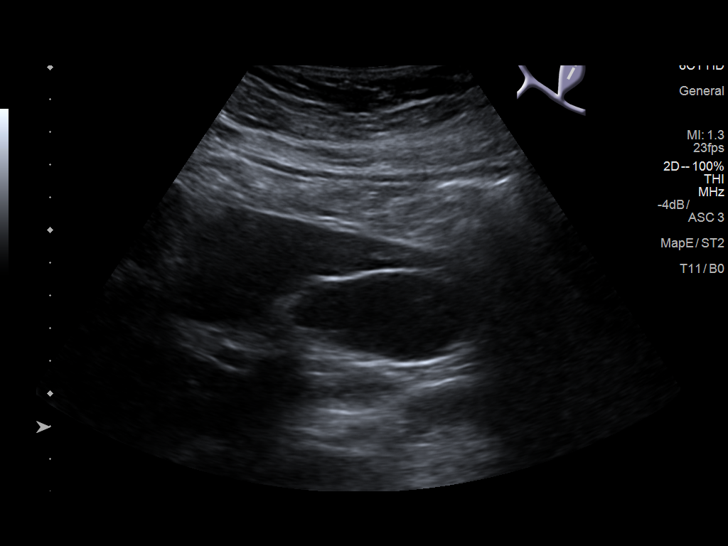
[im 17/44]
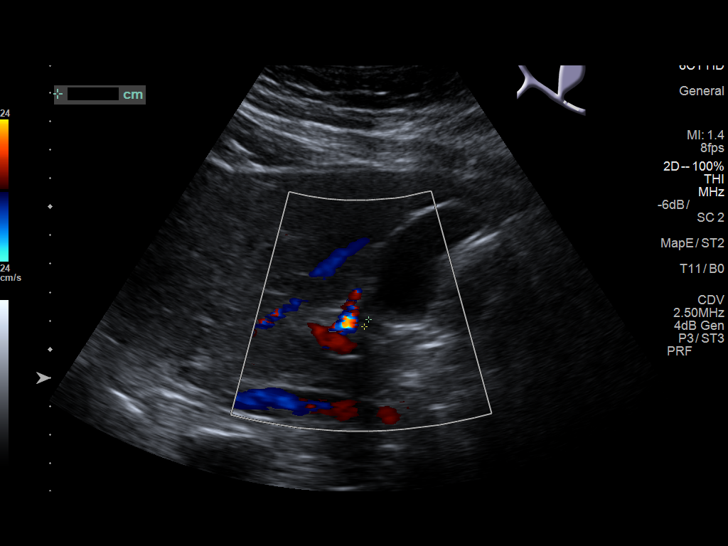
[im 20/44]
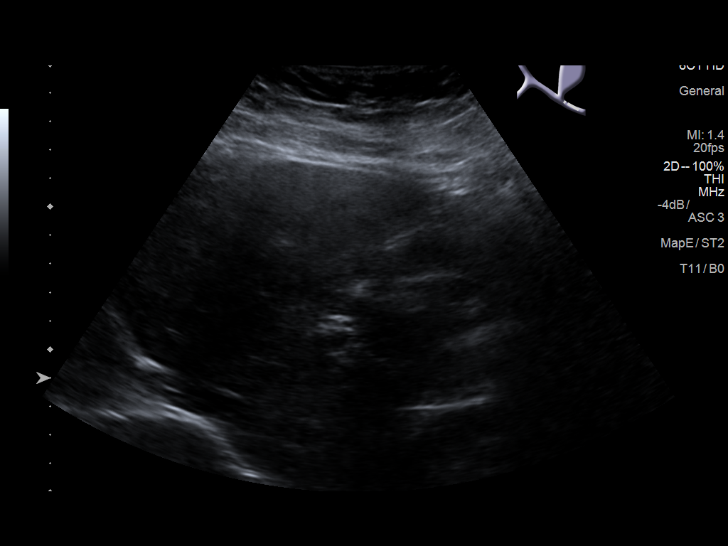
[im 24/44]
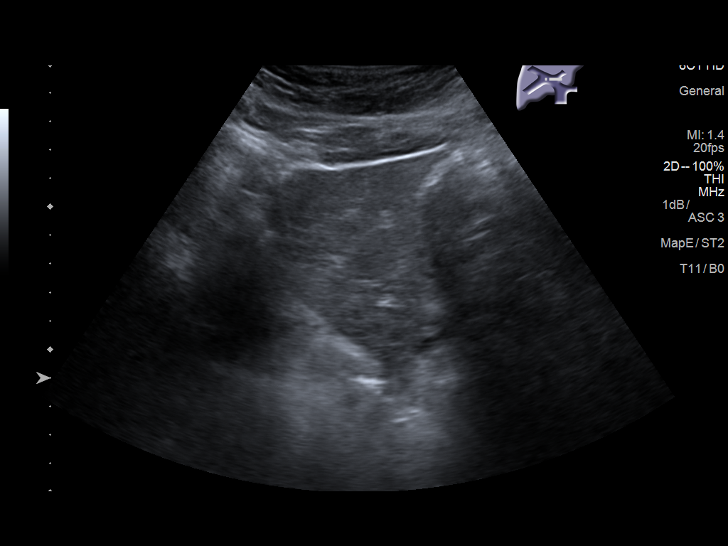
[im 27/44]
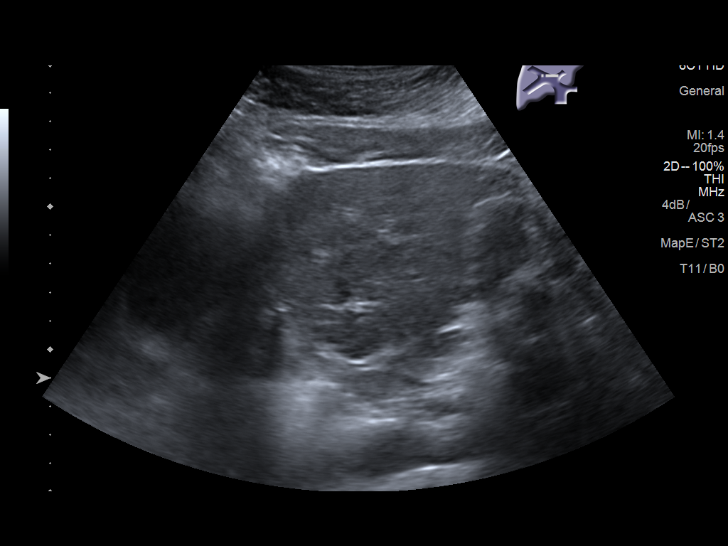
[im 29/44]
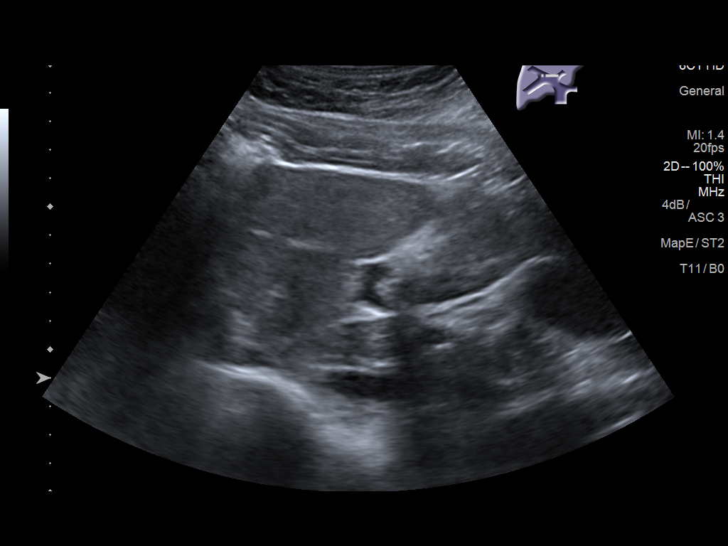
[im 33/44]
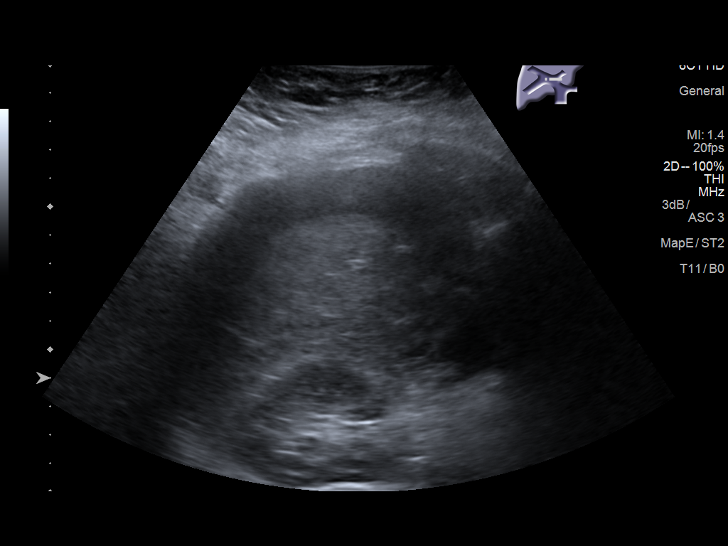
[im 36/44]
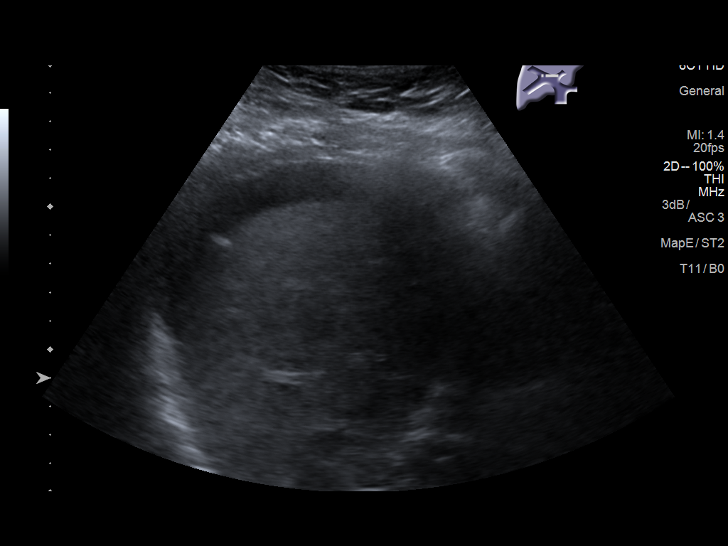
[im 40/44]
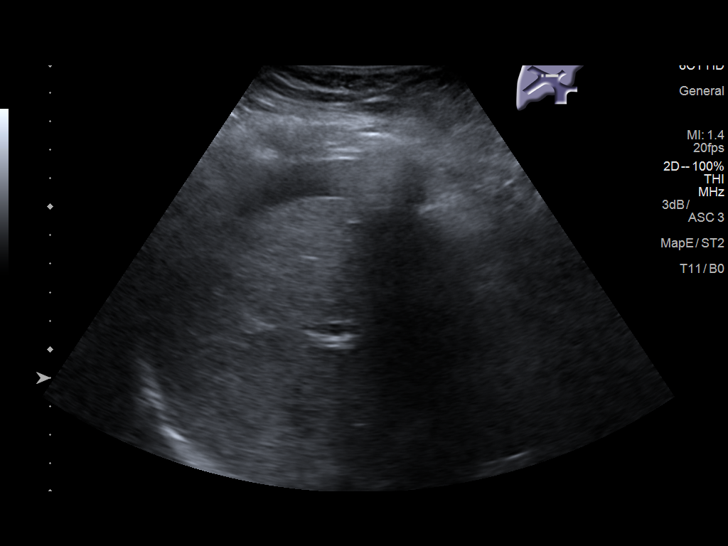
[im 44/44]
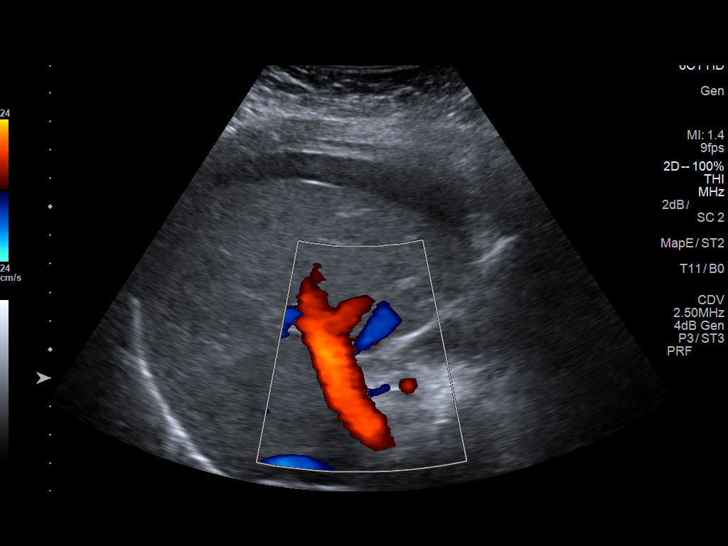

[14 of 25 positions shown; findings below may reference images not displayed]

FINDINGS: Gallbladder:

No gallstones or wall thickening visualized. No sonographic Murphy
sign noted by sonographer. Fluid adjacent to the gallbladder
(nonspecific).

Common bile duct:

Diameter: 2.9 mm

Liver:

No focal lesion identified. Within normal limits in parenchymal
echogenicity. Portal vein is patent on color Doppler imaging with
normal direction of blood flow towards the liver.

Other:

Ascites.
IMPRESSION: 1. No cholelithiasis or findings to suggest an acute cholecystitis.
2. Ascites.

## 2018-12-19 IMAGING — US US OB TRANSVAGINAL
1 series · 13 of 28 positions shown · non-contrast
Comparison: None.

CLINICAL DATA: Acute onset of pelvic pain.

EXAM:
OBSTETRIC <14 WK US AND TRANSVAGINAL OB US
TECHNIQUE: Both transabdominal and transvaginal ultrasound examinations were
performed for complete evaluation of the gestation as well as the
maternal uterus, adnexal regions, and pelvic cul-de-sac.
Transvaginal technique was performed to assess early pregnancy.

[Series 1: us ob transvaginal · 0.28mm/px · 62 acquisitions, 13 frames shown]
[im 3/62]
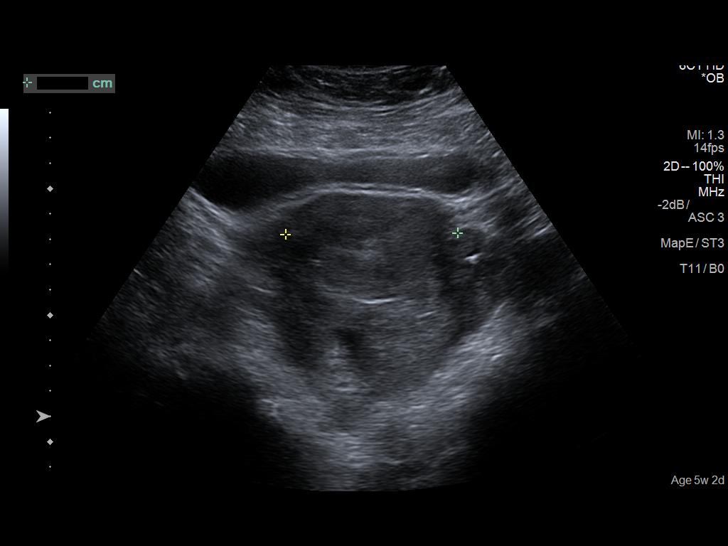
[im 7/62]
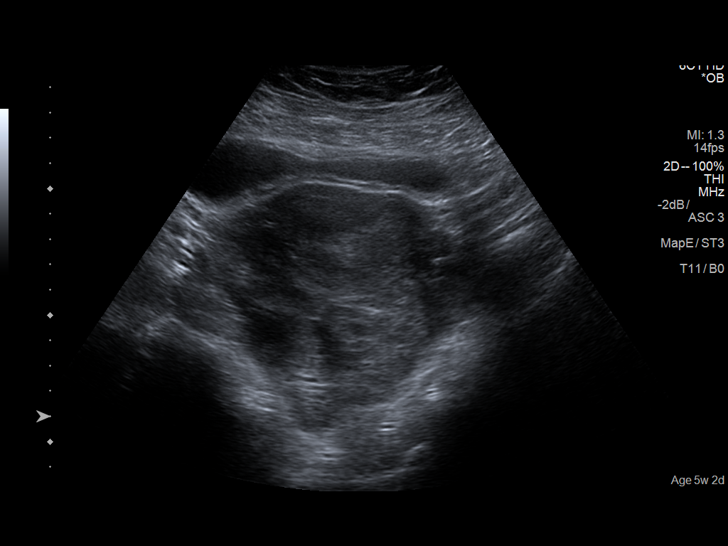
[im 12/62]
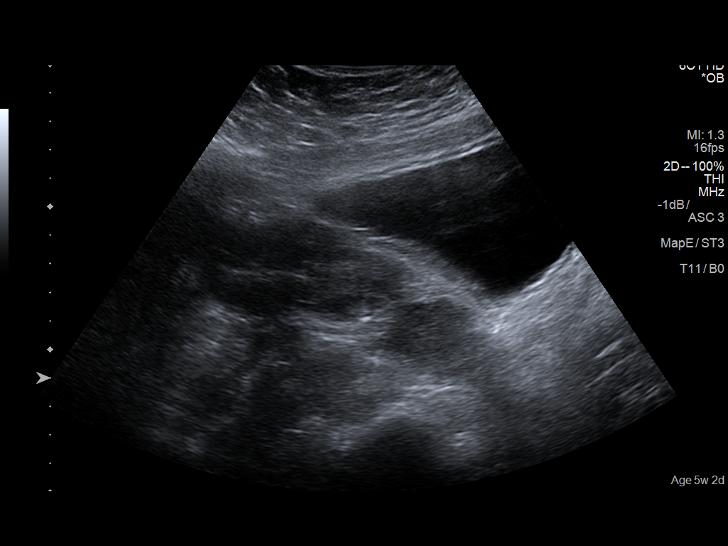
[im 16/62]
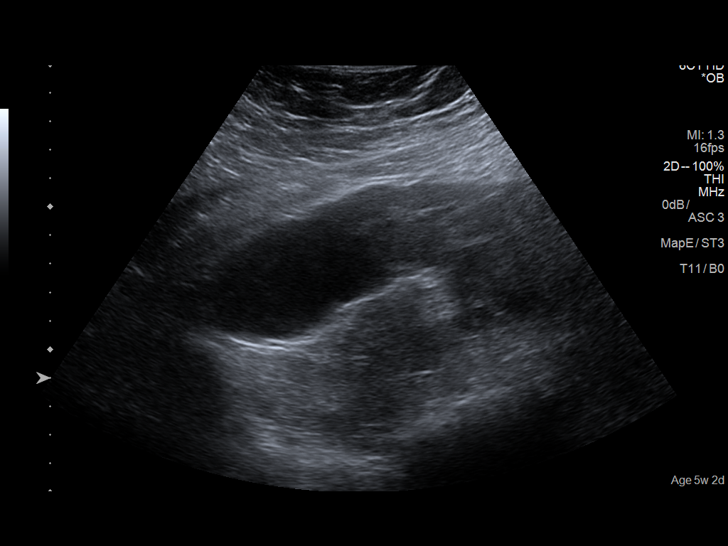
[im 21/62]
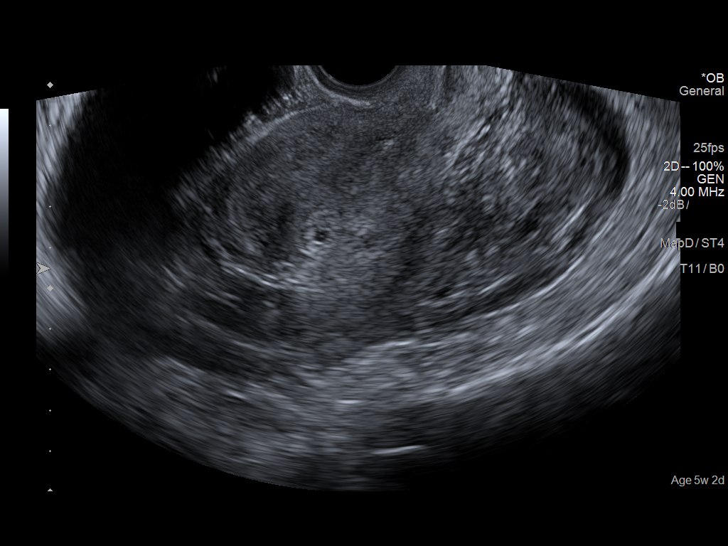
[im 25/62]
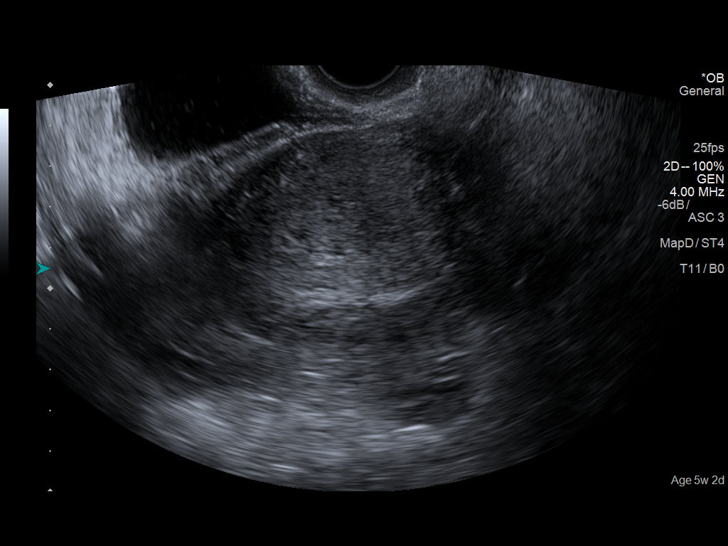
[im 32/62]
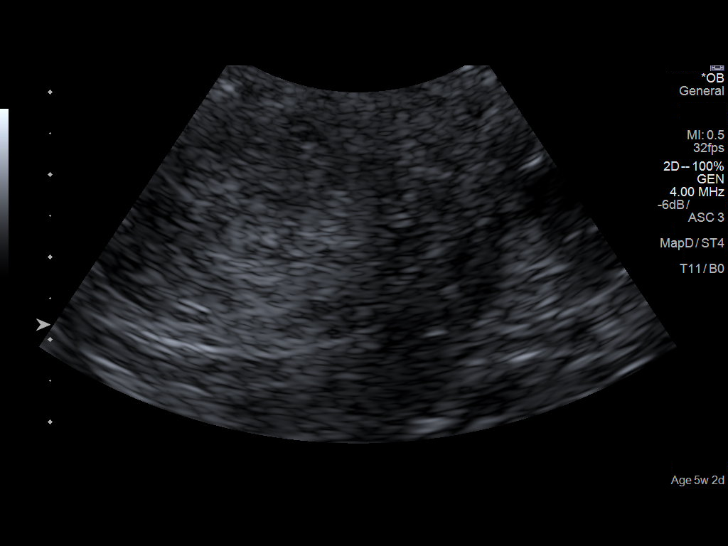
[im 37/62]
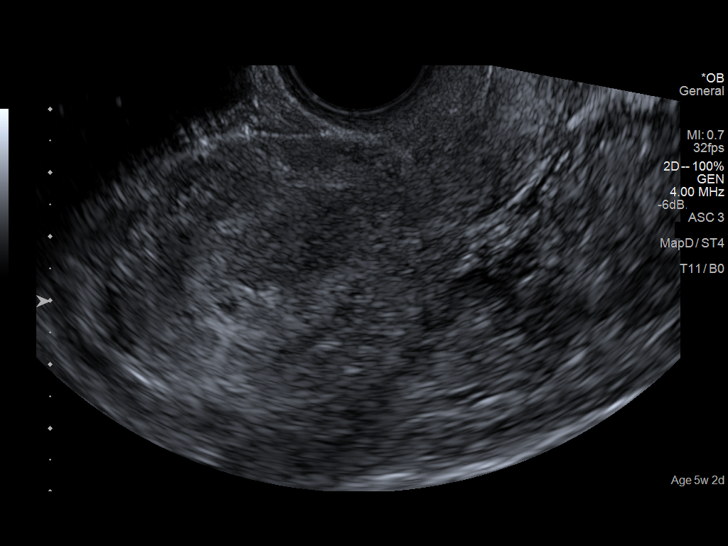
[im 41/62]
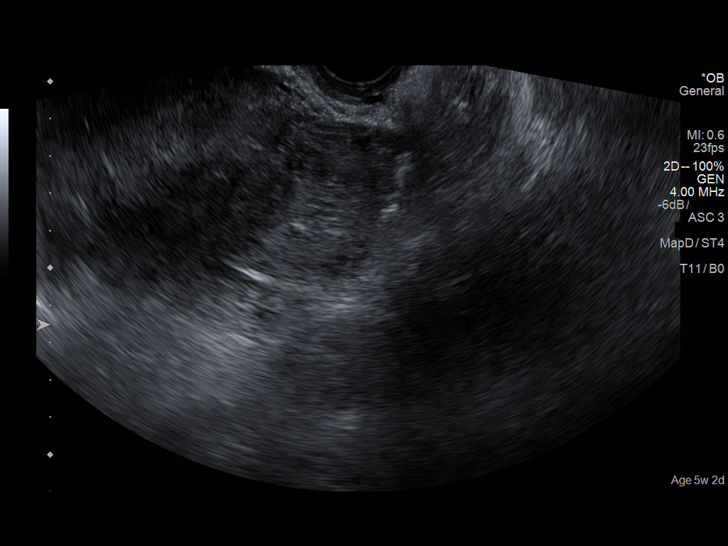
[im 46/62]
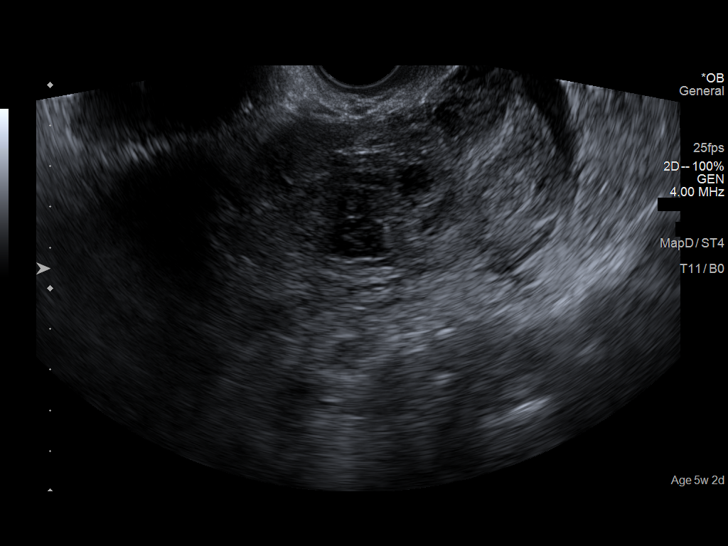
[im 50/62]
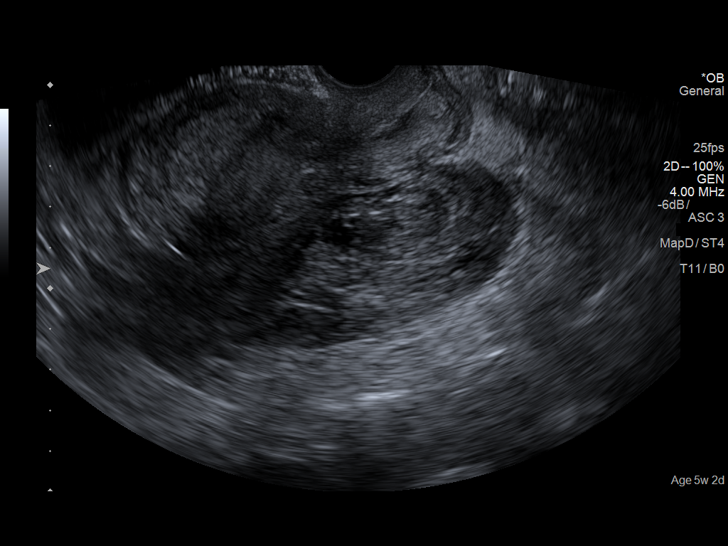
[im 55/62]
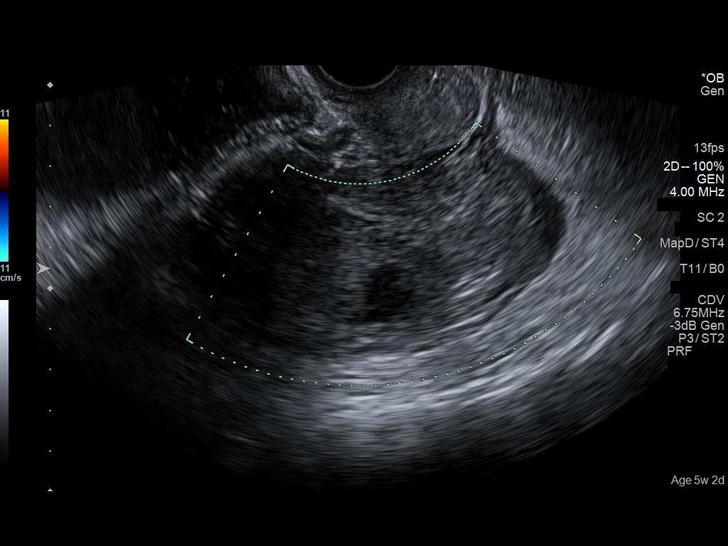
[im 59/62]
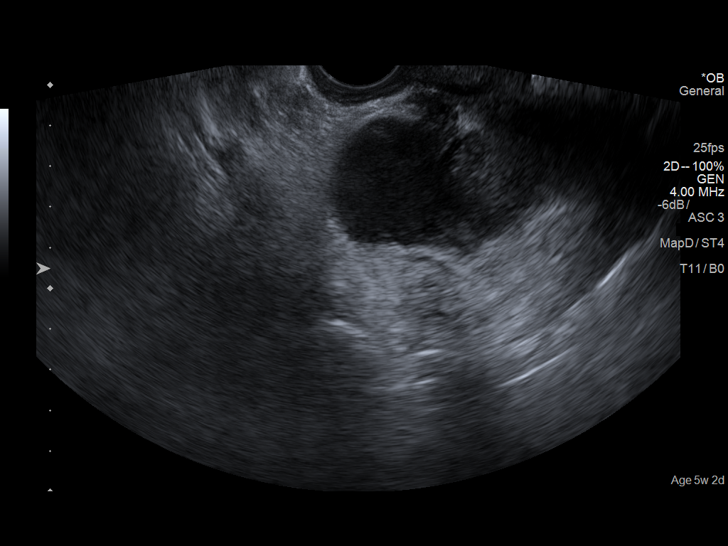

[13 of 28 positions shown; findings below may reference images not displayed]

FINDINGS: Intrauterine gestational sac: Question of tiny intrauterine
gestational sac, though this could also reflect a pseudosac.

Yolk sac:  No

Embryo:  No

MSD (possible intrauterine gestational sac): 3.7  mm   5 w   1  d

Subchorionic hemorrhage:  None visualized.

Maternal uterus/adnexae: The ovaries are not visualized. There is a
complex heterogeneous structure filling the pelvic cul-de-sac
extending into the right adnexa, measuring 12.0 x 4.0 x 9.5 cm. This
is concerning for clot. A moderate to large amount of free fluid is
noted within the pelvis. No definite ectopic pregnancy is
characterized, but this raises concern for rupture of an ectopic
pregnancy.

Free fluid is also noted within the upper abdomen on concurrent
right upper quadrant ultrasound.
IMPRESSION: 1. Complex heterogeneous structure filling the pelvic cul-de-sac,
extending into the right adnexa, measuring 12.0 x 4.0 x 9.5 cm. This
is concerning for clot. Moderate to large amount of free fluid noted
within the abdomen and pelvis. Though no definite ectopic pregnancy
is seen, this raises concern for ruptured ectopic pregnancy. Would
correlate with the patient's symptoms and Hgb/Hct.
2. Question of tiny intrauterine gestational sac, though this could
also reflect a pseudosac.

Critical Value/emergent results were called by telephone at the time
of interpretation on 03/29/2018 at [DATE] to Pla PA, who verbally
acknowledged these results.

## 2019-09-06 DIAGNOSIS — Z01419 Encounter for gynecological examination (general) (routine) without abnormal findings: Secondary | ICD-10-CM | POA: Diagnosis not present

## 2019-09-06 DIAGNOSIS — Z6837 Body mass index (BMI) 37.0-37.9, adult: Secondary | ICD-10-CM | POA: Diagnosis not present

## 2019-09-06 DIAGNOSIS — Z23 Encounter for immunization: Secondary | ICD-10-CM | POA: Diagnosis not present

## 2019-10-03 IMAGING — US US OB COMP LESS 14 WK
1 series · 14 of 28 positions shown · non-contrast
Comparison: None.

CLINICAL DATA: 36-year-old pregnant female presents for follow-up
of complex pelvic cul-de-sac/right adnexal mass, free pelvic fluid
and questionable intrauterine gestational sac. Quantitative beta HCG
1,702 today, compared to 1,662 yesterday.

EDC by LMP: 11/27/2018, projecting to an expected gestational age of
5 weeks 3 days.
EXAM:
TRANSVAGINAL OB ULTRASOUND
TECHNIQUE: Transvaginal ultrasound was performed for complete evaluation of the
gestation as well as the maternal uterus, adnexal regions, and
pelvic cul-de-sac.

[Series 1: us ob comp less 14 wk · 14 of 91 slices shown]
[im 4/91]
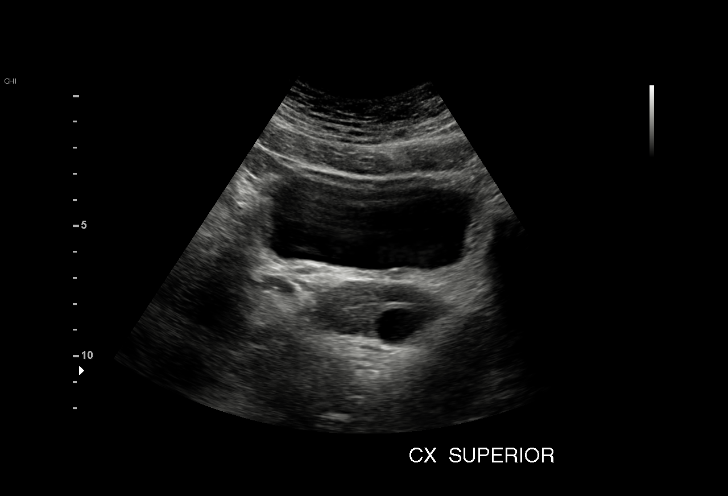
[im 11/91]
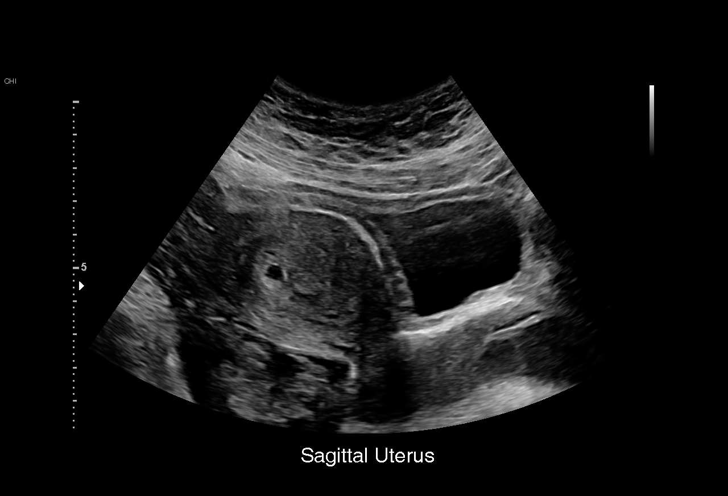
[im 17/91]
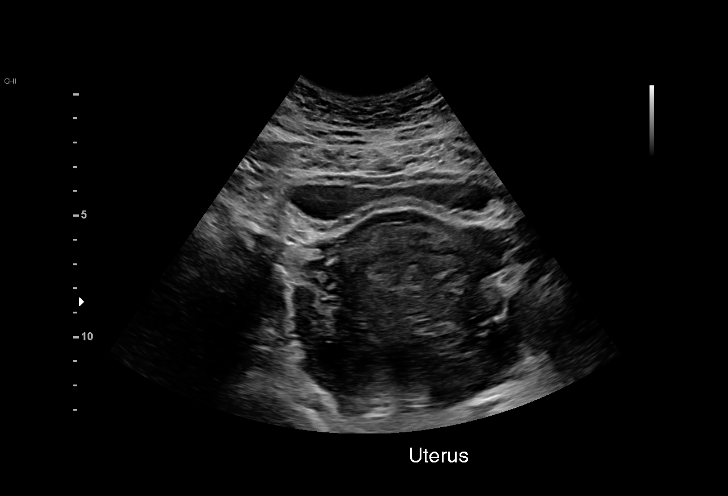
[im 24/91]
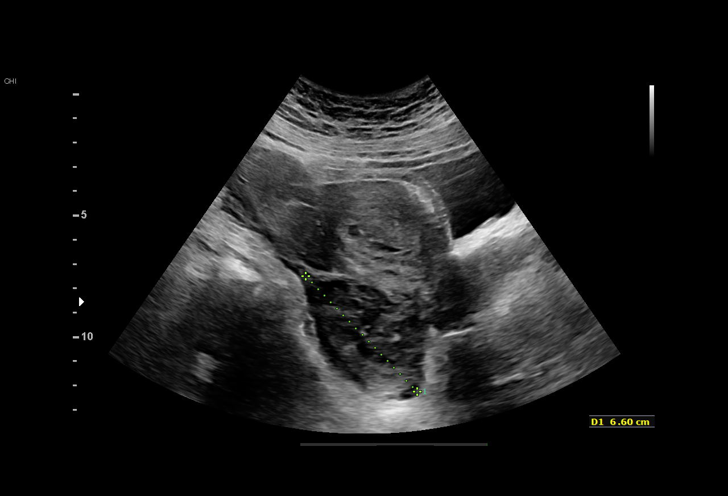
[im 31/91]
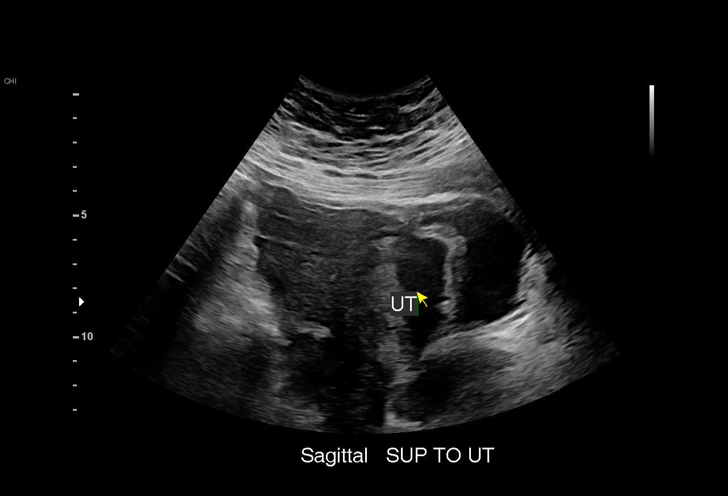
[im 37/91]
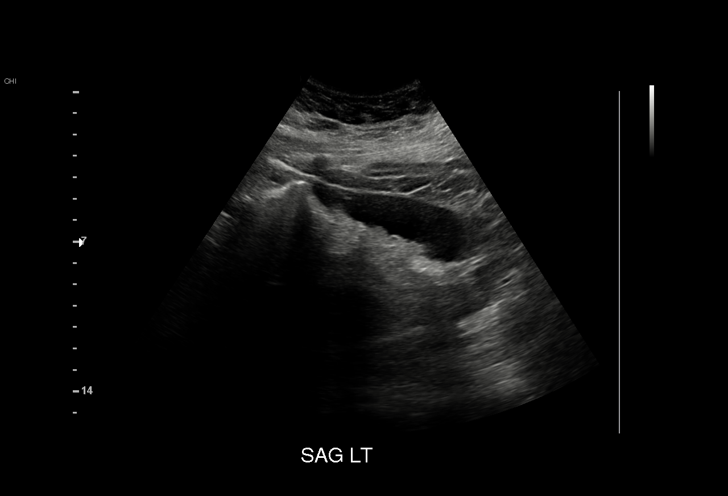
[im 44/91]
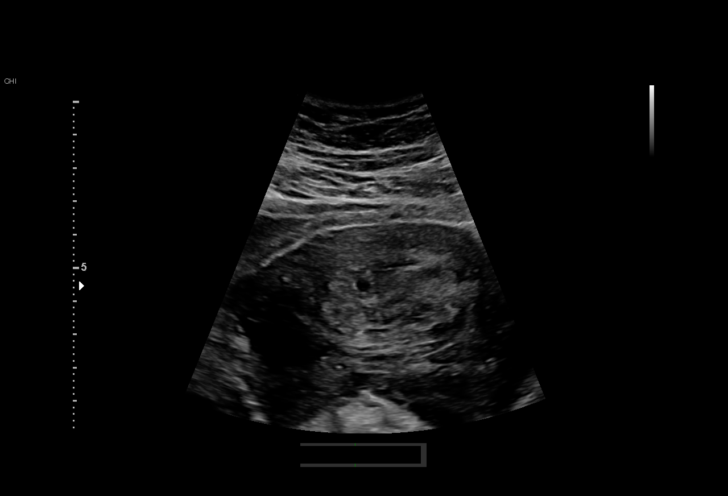
[im 51/91]
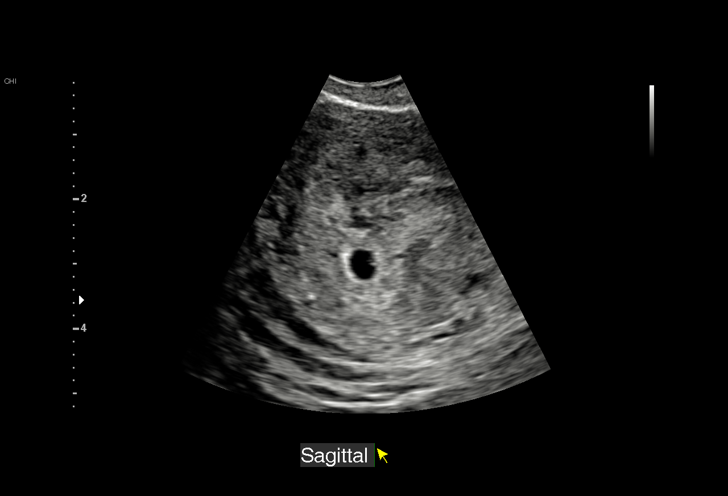
[im 57/91]
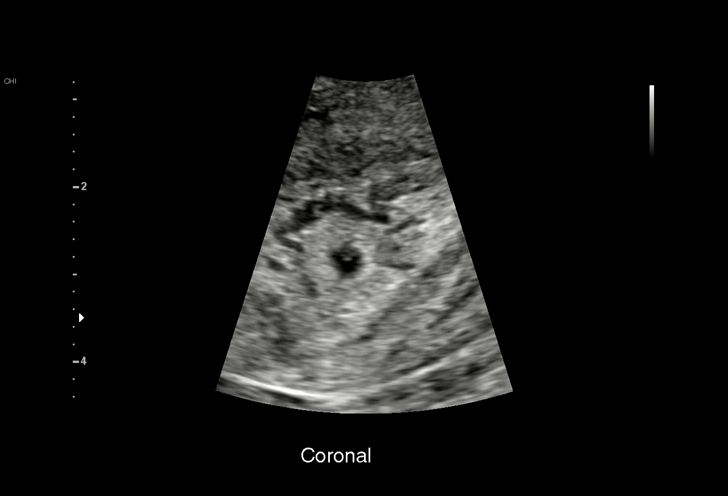
[im 64/91]
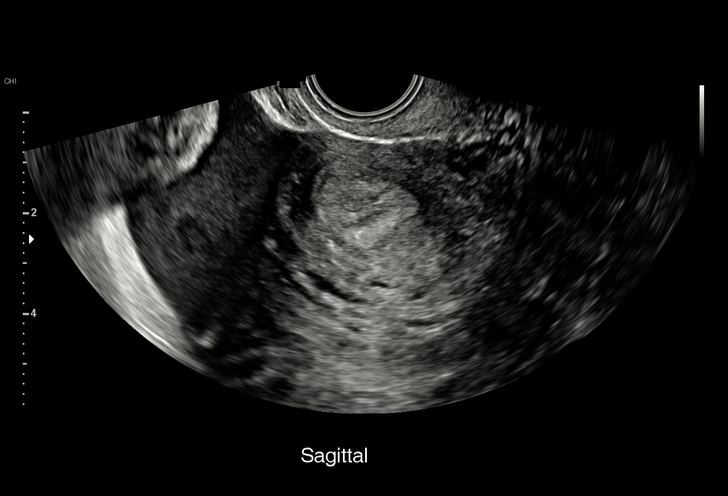
[im 71/91]
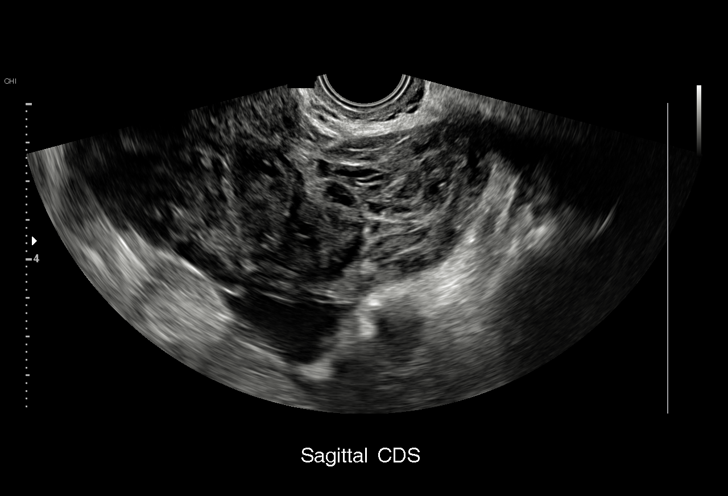
[im 77/91]
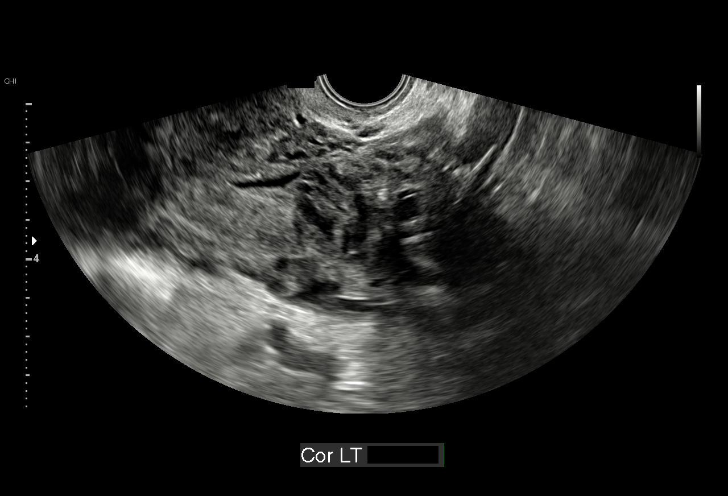
[im 84/91]
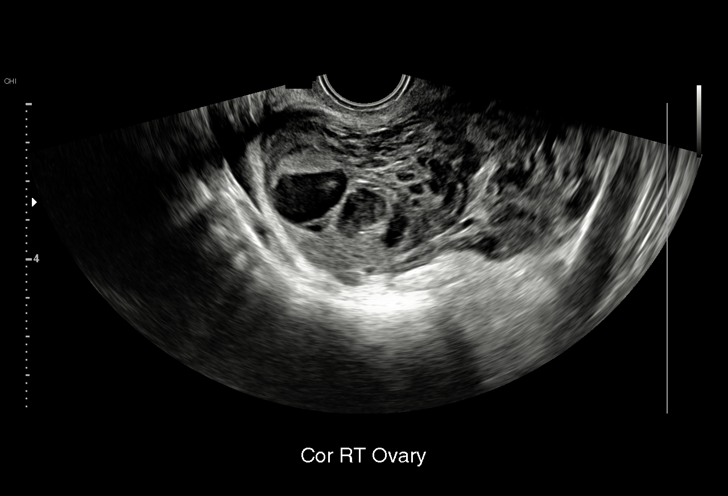
[im 91/91]
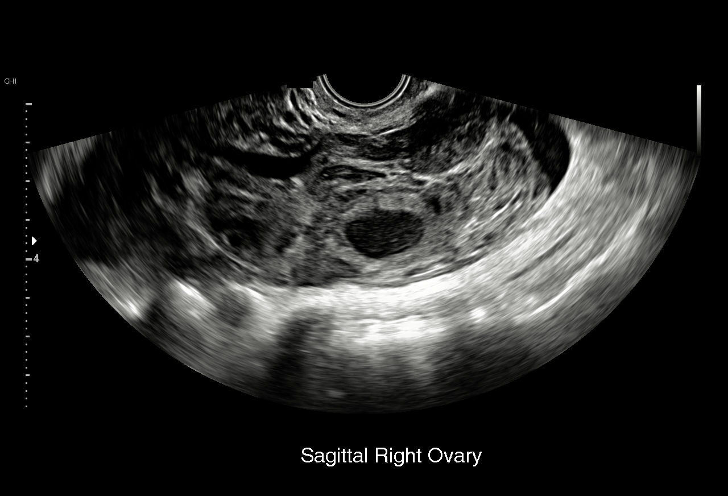

[14 of 28 positions shown; findings below may reference images not displayed]

FINDINGS: Anteverted uterus, with no uterine fibroids. Bilayer endometrial
thickness 15 mm. Heterogeneous endometrium. Sac-like intrauterine
structure with questionable double decidual sac sign, with mean sac
diameter 4.2 mm, which would project to a gestational age of 5 weeks
1 day, minimally increased from 3.7 mm on 03/29/2018 scan. No
definitive yolk sac, embryo or embryonic cardiac activity within
this sac-like structure.

There is a large right adnexal hematoma measuring 11.2 x 4.3 x
cm, which surrounds the right ovary and extends into and distends
the pelvic cul-de-sac, and is not appreciably changed from
03/29/2018 scan where it measured 12.0 x 4.0 x 8.9 cm. No discrete
gestational sac, yolk sac, embryo or embryonic cardiac activity are
demonstrated within this right adnexal hematoma. The right ovary
measures 4.1 x 3.1 x 3.9 cm and contains a few subjacent hemorrhagic
cysts measuring up to 2.5 cm. The left ovary measures 2.6 x 2.4 cm.
There is small left adnexal hematoma surrounding the left ovary,
less prominent compared to the right adnexal hematoma.

There is large volume hemoperitoneum throughout the pelvis (not
appreciably changed since the scan from 1 day prior) extending into
the bilateral paracolic gutters and perihepatic/perisplenic spaces,
not previously imaged.
IMPRESSION: 1. Large right adnexal hematoma surrounding the right ovary, with
large volume hemoperitoneum throughout the pelvis and abdomen, not
appreciably changed since the scan from 1 day prior. Findings are
most suggestive of a right tubal ectopic gestation with possible
right tubal rupture. No gestational sac demonstrated within the
right adnexal hematoma.
2. Indeterminate intrauterine sac-like structure, which is equivocal
for intrauterine gestation versus pseudo-gestational sac. No
definitive features of pregnancy such as a definite yolk sac,
definite embryo or embryonic cardiac activity within this sac-like
structure. Minimal growth of this sac-like structure since the scan
from 1 day prior. Short-term follow-up pelvic sonogram suggested.

Critical Value/emergent results were discussed in person at the time
of interpretation on 03/30/2018 at [DATE] with DR. SCHIFF, who
verbally acknowledged these results.

## 2019-11-15 DIAGNOSIS — Z3A01 Less than 8 weeks gestation of pregnancy: Secondary | ICD-10-CM | POA: Diagnosis not present

## 2019-11-15 DIAGNOSIS — O0901 Supervision of pregnancy with history of infertility, first trimester: Secondary | ICD-10-CM | POA: Diagnosis not present

## 2019-11-15 DIAGNOSIS — Z3689 Encounter for other specified antenatal screening: Secondary | ICD-10-CM | POA: Diagnosis not present

## 2019-11-15 DIAGNOSIS — Z3200 Encounter for pregnancy test, result unknown: Secondary | ICD-10-CM | POA: Diagnosis not present

## 2019-11-17 DIAGNOSIS — O0901 Supervision of pregnancy with history of infertility, first trimester: Secondary | ICD-10-CM | POA: Diagnosis not present

## 2019-11-17 DIAGNOSIS — Z3A01 Less than 8 weeks gestation of pregnancy: Secondary | ICD-10-CM | POA: Diagnosis not present

## 2019-11-29 DIAGNOSIS — O26859 Spotting complicating pregnancy, unspecified trimester: Secondary | ICD-10-CM | POA: Diagnosis not present

## 2019-11-29 DIAGNOSIS — Z3A08 8 weeks gestation of pregnancy: Secondary | ICD-10-CM | POA: Diagnosis not present

## 2019-11-29 DIAGNOSIS — Z3201 Encounter for pregnancy test, result positive: Secondary | ICD-10-CM | POA: Diagnosis not present

## 2019-12-02 DIAGNOSIS — Z3A35 35 weeks gestation of pregnancy: Secondary | ICD-10-CM | POA: Diagnosis not present

## 2019-12-02 DIAGNOSIS — O0903 Supervision of pregnancy with history of infertility, third trimester: Secondary | ICD-10-CM | POA: Diagnosis not present

## 2020-06-16 DIAGNOSIS — Z3200 Encounter for pregnancy test, result unknown: Secondary | ICD-10-CM | POA: Diagnosis not present

## 2020-06-16 DIAGNOSIS — O09291 Supervision of pregnancy with other poor reproductive or obstetric history, first trimester: Secondary | ICD-10-CM | POA: Diagnosis not present

## 2020-06-16 DIAGNOSIS — Z3689 Encounter for other specified antenatal screening: Secondary | ICD-10-CM | POA: Diagnosis not present

## 2020-06-16 DIAGNOSIS — Z3A01 Less than 8 weeks gestation of pregnancy: Secondary | ICD-10-CM | POA: Diagnosis not present

## 2020-06-16 LAB — OB RESULTS CONSOLE ABO/RH: RH Type: POSITIVE

## 2020-06-16 LAB — OB RESULTS CONSOLE HEPATITIS B SURFACE ANTIGEN: Hepatitis B Surface Ag: NEGATIVE

## 2020-06-16 LAB — OB RESULTS CONSOLE GC/CHLAMYDIA
Chlamydia: NEGATIVE
Gonorrhea: NEGATIVE

## 2020-06-16 LAB — OB RESULTS CONSOLE RPR: RPR: NONREACTIVE

## 2020-06-16 LAB — OB RESULTS CONSOLE RUBELLA ANTIBODY, IGM: Rubella: IMMUNE

## 2020-06-16 LAB — OB RESULTS CONSOLE ANTIBODY SCREEN: Antibody Screen: NEGATIVE

## 2020-06-16 LAB — OB RESULTS CONSOLE HIV ANTIBODY (ROUTINE TESTING): HIV: NONREACTIVE

## 2020-06-19 DIAGNOSIS — Z3A01 Less than 8 weeks gestation of pregnancy: Secondary | ICD-10-CM | POA: Diagnosis not present

## 2020-06-19 DIAGNOSIS — O09291 Supervision of pregnancy with other poor reproductive or obstetric history, first trimester: Secondary | ICD-10-CM | POA: Diagnosis not present

## 2020-06-21 DIAGNOSIS — Z3A01 Less than 8 weeks gestation of pregnancy: Secondary | ICD-10-CM | POA: Diagnosis not present

## 2020-06-21 DIAGNOSIS — O09291 Supervision of pregnancy with other poor reproductive or obstetric history, first trimester: Secondary | ICD-10-CM | POA: Diagnosis not present

## 2020-06-28 DIAGNOSIS — Z3201 Encounter for pregnancy test, result positive: Secondary | ICD-10-CM | POA: Diagnosis not present

## 2020-07-24 DIAGNOSIS — Z3A09 9 weeks gestation of pregnancy: Secondary | ICD-10-CM | POA: Diagnosis not present

## 2020-07-24 DIAGNOSIS — Z3689 Encounter for other specified antenatal screening: Secondary | ICD-10-CM | POA: Diagnosis not present

## 2020-07-24 DIAGNOSIS — Z8759 Personal history of other complications of pregnancy, childbirth and the puerperium: Secondary | ICD-10-CM | POA: Diagnosis not present

## 2020-07-24 DIAGNOSIS — O09521 Supervision of elderly multigravida, first trimester: Secondary | ICD-10-CM | POA: Diagnosis not present

## 2020-07-25 DIAGNOSIS — Z20822 Contact with and (suspected) exposure to covid-19: Secondary | ICD-10-CM | POA: Diagnosis not present

## 2020-07-26 DIAGNOSIS — O09521 Supervision of elderly multigravida, first trimester: Secondary | ICD-10-CM | POA: Diagnosis not present

## 2020-07-26 DIAGNOSIS — Z3689 Encounter for other specified antenatal screening: Secondary | ICD-10-CM | POA: Diagnosis not present

## 2020-07-26 DIAGNOSIS — Z8759 Personal history of other complications of pregnancy, childbirth and the puerperium: Secondary | ICD-10-CM | POA: Diagnosis not present

## 2020-08-21 DIAGNOSIS — Z3A13 13 weeks gestation of pregnancy: Secondary | ICD-10-CM | POA: Diagnosis not present

## 2020-08-21 DIAGNOSIS — O09521 Supervision of elderly multigravida, first trimester: Secondary | ICD-10-CM | POA: Diagnosis not present

## 2020-08-21 DIAGNOSIS — Z23 Encounter for immunization: Secondary | ICD-10-CM | POA: Diagnosis not present

## 2020-09-13 DIAGNOSIS — O09522 Supervision of elderly multigravida, second trimester: Secondary | ICD-10-CM | POA: Diagnosis not present

## 2020-09-13 DIAGNOSIS — Z361 Encounter for antenatal screening for raised alphafetoprotein level: Secondary | ICD-10-CM | POA: Diagnosis not present

## 2020-09-13 DIAGNOSIS — Z3A17 17 weeks gestation of pregnancy: Secondary | ICD-10-CM | POA: Diagnosis not present

## 2020-09-17 DIAGNOSIS — Z20822 Contact with and (suspected) exposure to covid-19: Secondary | ICD-10-CM | POA: Diagnosis not present

## 2020-09-27 DIAGNOSIS — O09522 Supervision of elderly multigravida, second trimester: Secondary | ICD-10-CM | POA: Diagnosis not present

## 2020-09-27 DIAGNOSIS — Z3A19 19 weeks gestation of pregnancy: Secondary | ICD-10-CM | POA: Diagnosis not present

## 2020-10-13 DIAGNOSIS — Z362 Encounter for other antenatal screening follow-up: Secondary | ICD-10-CM | POA: Diagnosis not present

## 2021-01-31 ENCOUNTER — Other Ambulatory Visit: Payer: Self-pay | Admitting: Obstetrics

## 2021-01-31 ENCOUNTER — Telehealth (HOSPITAL_COMMUNITY): Payer: Self-pay | Admitting: *Deleted

## 2021-01-31 ENCOUNTER — Encounter (HOSPITAL_COMMUNITY): Payer: Self-pay

## 2021-01-31 NOTE — Telephone Encounter (Signed)
Preadmission screen  

## 2021-01-31 NOTE — Patient Instructions (Signed)
Carol Webb  01/31/2021   Your procedure is scheduled on:  02/14/2021  Arrive at 1230 at Entrance C on [address] at [facility]  and [address]. You are invited to use the FREE valet parking or use the Visitor's parking deck.  Pick up the phone at the desk and dial [phone].  Call this number if you have problems the morning of surgery: [phone]  Remember:   Do not eat food:(After Midnight) Desps de medianoche.  Do not drink clear liquids: (After Midnight) Desps de medianoche.  Take these medicines the morning of surgery with A SIP OF WATER:  None   Do not wear jewelry, make-up or nail polish.  Do not wear lotions, powders, or perfumes. Do not wear deodorant.  Do not shave 48 hours prior to surgery.  Do not bring valuables to the hospital.  [facility] is not   responsible for any belongings or valuables brought to the hospital.  Contacts, dentures or bridgework may not be worn into surgery.  Leave suitcase in the car. After surgery it may be brought to your room.  For patients admitted to the hospital, checkout time is 11:00 AM the day of              discharge.      Please read over the following fact sheets that you were given:     Preparing for Surgery

## 2021-02-01 ENCOUNTER — Telehealth (HOSPITAL_COMMUNITY): Payer: Self-pay | Admitting: *Deleted

## 2021-02-01 NOTE — Telephone Encounter (Signed)
Preadmission screen  

## 2021-02-02 ENCOUNTER — Telehealth (HOSPITAL_COMMUNITY): Payer: Self-pay | Admitting: *Deleted

## 2021-02-02 ENCOUNTER — Encounter (HOSPITAL_COMMUNITY): Payer: Self-pay

## 2021-02-02 NOTE — Telephone Encounter (Signed)
Preadmission screen  

## 2021-02-06 ENCOUNTER — Other Ambulatory Visit: Payer: Self-pay | Admitting: Obstetrics

## 2021-02-07 ENCOUNTER — Institutional Professional Consult (permissible substitution): Payer: BLUE CROSS/BLUE SHIELD | Admitting: Neurology

## 2021-02-12 ENCOUNTER — Other Ambulatory Visit (HOSPITAL_COMMUNITY)
Admission: RE | Admit: 2021-02-12 | Discharge: 2021-02-12 | Disposition: A | Discharge status: Home / Self Care | Payer: BC Managed Care – PPO | Source: Ambulatory Visit | Attending: Obstetrics | Admitting: Obstetrics

## 2021-02-12 ENCOUNTER — Other Ambulatory Visit: Payer: Self-pay

## 2021-02-12 ENCOUNTER — Encounter (HOSPITAL_COMMUNITY)
Admission: RE | Admit: 2021-02-12 | Discharge: 2021-02-12 | Disposition: A | Discharge status: Home / Self Care | Payer: BC Managed Care – PPO | Source: Ambulatory Visit | Attending: Obstetrics | Admitting: Obstetrics

## 2021-02-12 DIAGNOSIS — Z01812 Encounter for preprocedural laboratory examination: Secondary | ICD-10-CM | POA: Insufficient documentation

## 2021-02-12 DIAGNOSIS — Z20822 Contact with and (suspected) exposure to covid-19: Secondary | ICD-10-CM | POA: Insufficient documentation

## 2021-02-12 LAB — CBC
HCT: 37.9 % (ref 36.0–46.0)
Hemoglobin: 12.6 g/dL (ref 12.0–15.0)
MCH: 31.2 pg (ref 26.0–34.0)
MCHC: 33.2 g/dL (ref 30.0–36.0)
MCV: 93.8 fL (ref 80.0–100.0)
Platelets: 237 10*3/uL (ref 150–400)
RBC: 4.04 MIL/uL (ref 3.87–5.11)
RDW: 14.6 % (ref 11.5–15.5)
WBC: 8.8 10*3/uL (ref 4.0–10.5)
nRBC: 0 % (ref 0.0–0.2)

## 2021-02-12 LAB — TYPE AND SCREEN
ABO/RH(D): O POS
Antibody Screen: NEGATIVE

## 2021-02-13 LAB — SARS CORONAVIRUS 2 (TAT 6-24 HRS): SARS Coronavirus 2: NEGATIVE

## 2021-02-13 LAB — RPR: RPR Ser Ql: NONREACTIVE

## 2021-02-13 NOTE — H&P (Signed)
Carol Webb is a 40 y.o. G4P0020 at [redacted]w[redacted]d presenting for RCS. Pt notes rare contractions. Good fetal movement, No vaginal bleeding, not leaking fluid. No HA  [facility] at [facility] since 6 wks - Dated by LMP c/w 6 wk u/s -prior PCS, plans repeat with TL - h/o gest htn, on baby ASA, no htn this preg - ? Sleep apnea, sleep study planned earlier this week.    Prenatal Transfer Tool  Maternal Diabetes: No Genetic Screening: Normal Maternal Ultrasounds/Referrals: Normal Fetal Ultrasounds or other Referrals:  None Maternal Substance Abuse:  No Significant Maternal Medications:  None Significant Maternal Lab Results: Group B Strep negative     OB History    Gravida  4   Para  1   Term      Preterm      AB  2   Living  0     SAB  2   IAB      Ectopic      Multiple      Live Births             Past Medical History:  Diagnosis Date  . Medical history non-contributory    Past Surgical History:  Procedure Laterality Date  . CESAREAN SECTION N/A 11/25/2018   Procedure: CESAREAN SECTION;  Surgeon: Shea Evans, MD;  Location: [facility];  Service: Obstetrics;  Laterality: N/A;  . DIAGNOSTIC LAPAROSCOPY WITH REMOVAL OF ECTOPIC PREGNANCY N/A 03/30/2018   Procedure: DIAGNOSTIC LAPAROSCOPY WITH REMOVAL OF Ruptured Right Ovarian Cyst;  Surgeon: Olivia Mackie, MD;  Location: [facility] ORS;  Service: Gynecology;  Laterality: N/A;  . NO PAST SURGERIES     Family History: family history includes Atrial fibrillation in her mother; Colon cancer in her father; Prostate cancer in her father; Stroke in her mother. Social History:  reports that she quit smoking about 4 years ago. Her smoking use included cigarettes. She has never used smokeless tobacco. She reports current alcohol use. She reports current drug use. Drug: Marijuana.  Review of Systems - Negative except discomfort of pregnancy   Physical Exam:  Today's Vitals   02/14/21 1253  BP: (!) 143/96  Pulse: (!)  110  Resp: 17  Temp: 98.6 F (37 C)  TempSrc: Oral  SpO2: 96%  Weight: 132.5 kg  Height: 5\' 7"  (1.702 m)   Body mass index is 45.73 kg/m.  Gen: well appearing, no distress  Back: no CVAT Abd: gravid, NT, no RUQ pain, obese LE: trace edema, equal bilaterally, non-tender   Prenatal labs: ABO, Rh: --/--/O POS (04/18 1049) Antibody: NEG (04/18 1049) Rubella: Immune (08/20 0000) RPR: NON REACTIVE (04/18 1049)  HBsAg: Negative (08/20 0000)  HIV: Non-reactive (08/20 0000)  GBS:   neg 1 hr Glucola 120  Genetic screening normal Panorama, nl AFP Anatomy 10-02-2002 normal  CBC    Component Value Date/Time   WBC 8.8 02/12/2021 1049   RBC 4.04 02/12/2021 1049   HGB 12.6 02/12/2021 1049   HCT 37.9 02/12/2021 1049   PLT 237 02/12/2021 1049   MCV 93.8 02/12/2021 1049   MCH 31.2 02/12/2021 1049   MCHC 33.2 02/12/2021 1049   RDW 14.6 02/12/2021 1049   LYMPHSABS 1.6 06/15/2014 1144   MONOABS 0.6 06/15/2014 1144   EOSABS 0.1 06/15/2014 1144   BASOSABS 0.0 06/15/2014 1144     Assessment/Plan: 40 y.o. G4P0020 at [redacted]w[redacted]d - Prior c/s, term preg, plan RCS. Desires sterilization, plan b/l salpingectomy - h/o gest htn in prior preg, risk  factors present, watch closely PP    Lendon Colonel 02/13/2021, 10:07 PM  Lendon Colonel 02/14/2021 2:30 PM

## 2021-02-14 ENCOUNTER — Encounter (HOSPITAL_COMMUNITY): Payer: Self-pay | Admitting: Obstetrics

## 2021-02-14 ENCOUNTER — Inpatient Hospital Stay (HOSPITAL_COMMUNITY): Payer: BC Managed Care – PPO | Admitting: Anesthesiology

## 2021-02-14 ENCOUNTER — Other Ambulatory Visit: Payer: Self-pay

## 2021-02-14 ENCOUNTER — Inpatient Hospital Stay (HOSPITAL_COMMUNITY)
Admission: RE | Admit: 2021-02-14 | Discharge: 2021-02-16 | DRG: 784 | Disposition: A | Discharge status: Home / Self Care | Payer: BC Managed Care – PPO | Attending: Obstetrics | Admitting: Obstetrics

## 2021-02-14 ENCOUNTER — Encounter (HOSPITAL_COMMUNITY)
Admission: RE | Disposition: A | Discharge status: Home / Self Care | Payer: Self-pay | Source: Home / Self Care | Attending: Obstetrics

## 2021-02-14 DIAGNOSIS — Z302 Encounter for sterilization: Secondary | ICD-10-CM

## 2021-02-14 DIAGNOSIS — O34211 Maternal care for low transverse scar from previous cesarean delivery: Principal | ICD-10-CM | POA: Diagnosis present

## 2021-02-14 DIAGNOSIS — Z20822 Contact with and (suspected) exposure to covid-19: Secondary | ICD-10-CM | POA: Diagnosis present

## 2021-02-14 DIAGNOSIS — D62 Acute posthemorrhagic anemia: Secondary | ICD-10-CM | POA: Diagnosis not present

## 2021-02-14 DIAGNOSIS — O9081 Anemia of the puerperium: Secondary | ICD-10-CM | POA: Diagnosis not present

## 2021-02-14 DIAGNOSIS — O9902 Anemia complicating childbirth: Secondary | ICD-10-CM

## 2021-02-14 DIAGNOSIS — Z87891 Personal history of nicotine dependence: Secondary | ICD-10-CM | POA: Diagnosis not present

## 2021-02-14 DIAGNOSIS — O99214 Obesity complicating childbirth: Secondary | ICD-10-CM | POA: Diagnosis present

## 2021-02-14 DIAGNOSIS — Z98891 History of uterine scar from previous surgery: Secondary | ICD-10-CM

## 2021-02-14 DIAGNOSIS — Z3A39 39 weeks gestation of pregnancy: Secondary | ICD-10-CM | POA: Diagnosis not present

## 2021-02-14 SURGERY — Surgical Case
Anesthesia: Spinal | Site: Abdomen | Laterality: Bilateral | Wound class: Clean Contaminated

## 2021-02-14 MED ORDER — HYDROMORPHONE HCL 1 MG/ML IJ SOLN: 0.2500 mg | INTRAMUSCULAR | Status: DC | PRN

## 2021-02-14 MED ORDER — BUPIVACAINE IN DEXTROSE 0.75-8.25 % IT SOLN
INTRATHECAL | Status: DC | PRN
  Administered 2021-02-14: 1.6 mL via INTRATHECAL

## 2021-02-14 MED ORDER — SCOPOLAMINE 1 MG/3DAYS TD PT72
1.0000 | MEDICATED_PATCH | Freq: Once | TRANSDERMAL | Status: DC
  Administered 2021-02-14: 1.5 mg via TRANSDERMAL
  Filled 2021-02-14: qty 1

## 2021-02-14 MED ORDER — OXYTOCIN-SODIUM CHLORIDE 30-0.9 UT/500ML-% IV SOLN
INTRAVENOUS | Status: DC | PRN
  Administered 2021-02-14: 300 mL via INTRAVENOUS

## 2021-02-14 MED ORDER — NALBUPHINE HCL 10 MG/ML IJ SOLN: 5.0000 mg | INTRAMUSCULAR | Status: DC | PRN

## 2021-02-14 MED ORDER — FENTANYL CITRATE (PF) 100 MCG/2ML IJ SOLN
INTRAMUSCULAR | Status: AC
  Filled 2021-02-14: qty 2

## 2021-02-14 MED ORDER — KETOROLAC TROMETHAMINE 30 MG/ML IJ SOLN: 30.0000 mg | Freq: Four times a day (QID) | INTRAMUSCULAR | Status: AC | PRN

## 2021-02-14 MED ORDER — DIPHENHYDRAMINE HCL 25 MG PO CAPS: 25.0000 mg | ORAL_CAPSULE | ORAL | Status: DC | PRN

## 2021-02-14 MED ORDER — SOD CITRATE-CITRIC ACID 500-334 MG/5ML PO SOLN
ORAL | Status: AC
  Filled 2021-02-14: qty 30

## 2021-02-14 MED ORDER — PHENYLEPHRINE HCL-NACL 20-0.9 MG/250ML-% IV SOLN
INTRAVENOUS | Status: AC
  Filled 2021-02-14: qty 250

## 2021-02-14 MED ORDER — WITCH HAZEL-GLYCERIN EX PADS: 1.0000 "application " | MEDICATED_PAD | CUTANEOUS | Status: DC | PRN

## 2021-02-14 MED ORDER — PRENATAL MULTIVITAMIN CH
1.0000 | ORAL_TABLET | Freq: Every day | ORAL | Status: DC
  Administered 2021-02-15: 1 via ORAL
  Filled 2021-02-14: qty 1

## 2021-02-14 MED ORDER — POVIDONE-IODINE 10 % EX SWAB
2.0000 "application " | Freq: Once | CUTANEOUS | Status: AC
  Administered 2021-02-14: 2 via TOPICAL

## 2021-02-14 MED ORDER — DEXAMETHASONE SODIUM PHOSPHATE 4 MG/ML IJ SOLN
INTRAMUSCULAR | Status: DC | PRN
  Administered 2021-02-14: 4 mg via INTRAVENOUS

## 2021-02-14 MED ORDER — MENTHOL 3 MG MT LOZG: 1.0000 | LOZENGE | OROMUCOSAL | Status: DC | PRN

## 2021-02-14 MED ORDER — STERILE WATER FOR IRRIGATION IR SOLN
Status: DC | PRN
  Administered 2021-02-14: 1000 mL

## 2021-02-14 MED ORDER — OXYCODONE HCL 5 MG PO TABS
5.0000 mg | ORAL_TABLET | ORAL | Status: DC | PRN
  Administered 2021-02-15: 10 mg via ORAL
  Administered 2021-02-16: 5 mg via ORAL
  Filled 2021-02-14: qty 1
  Filled 2021-02-14: qty 2

## 2021-02-14 MED ORDER — DEXAMETHASONE SODIUM PHOSPHATE 4 MG/ML IJ SOLN
INTRAMUSCULAR | Status: AC
  Filled 2021-02-14: qty 1

## 2021-02-14 MED ORDER — ONDANSETRON HCL 4 MG/2ML IJ SOLN: 4.0000 mg | Freq: Once | INTRAMUSCULAR | Status: DC | PRN

## 2021-02-14 MED ORDER — DIBUCAINE (PERIANAL) 1 % EX OINT: 1.0000 "application " | TOPICAL_OINTMENT | CUTANEOUS | Status: DC | PRN

## 2021-02-14 MED ORDER — SODIUM CHLORIDE 0.9 % IR SOLN
Status: DC | PRN
  Administered 2021-02-14: 1000 mL

## 2021-02-14 MED ORDER — ONDANSETRON HCL 4 MG/2ML IJ SOLN
INTRAMUSCULAR | Status: AC
  Filled 2021-02-14: qty 2

## 2021-02-14 MED ORDER — SIMETHICONE 80 MG PO CHEW
80.0000 mg | CHEWABLE_TABLET | ORAL | Status: DC | PRN
  Administered 2021-02-15: 80 mg via ORAL
  Filled 2021-02-14: qty 1

## 2021-02-14 MED ORDER — SOD CITRATE-CITRIC ACID 500-334 MG/5ML PO SOLN
30.0000 mL | Freq: Once | ORAL | Status: AC
  Administered 2021-02-14: 30 mL via ORAL

## 2021-02-14 MED ORDER — OXYCODONE HCL 5 MG/5ML PO SOLN: 5.0000 mg | Freq: Once | ORAL | Status: DC | PRN

## 2021-02-14 MED ORDER — NALBUPHINE HCL 10 MG/ML IJ SOLN: 5.0000 mg | Freq: Once | INTRAMUSCULAR | Status: DC | PRN

## 2021-02-14 MED ORDER — ONDANSETRON HCL 4 MG/2ML IJ SOLN
INTRAMUSCULAR | Status: DC | PRN
  Administered 2021-02-14: 4 mg via INTRAVENOUS

## 2021-02-14 MED ORDER — DIPHENHYDRAMINE HCL 50 MG/ML IJ SOLN: 12.5000 mg | INTRAMUSCULAR | Status: DC | PRN

## 2021-02-14 MED ORDER — OXYTOCIN-SODIUM CHLORIDE 30-0.9 UT/500ML-% IV SOLN: 2.5000 [IU]/h | INTRAVENOUS | Status: AC

## 2021-02-14 MED ORDER — LACTATED RINGERS IV SOLN: INTRAVENOUS | Status: DC

## 2021-02-14 MED ORDER — SIMETHICONE 80 MG PO CHEW
80.0000 mg | CHEWABLE_TABLET | Freq: Three times a day (TID) | ORAL | Status: DC
  Administered 2021-02-15 – 2021-02-16 (×3): 80 mg via ORAL
  Filled 2021-02-14 (×3): qty 1

## 2021-02-14 MED ORDER — NALOXONE HCL 4 MG/10ML IJ SOLN
1.0000 ug/kg/h | INTRAVENOUS | Status: DC | PRN
  Filled 2021-02-14: qty 5

## 2021-02-14 MED ORDER — OXYTOCIN-SODIUM CHLORIDE 30-0.9 UT/500ML-% IV SOLN
INTRAVENOUS | Status: AC
  Filled 2021-02-14: qty 500

## 2021-02-14 MED ORDER — DIPHENHYDRAMINE HCL 25 MG PO CAPS: 25.0000 mg | ORAL_CAPSULE | Freq: Four times a day (QID) | ORAL | Status: DC | PRN

## 2021-02-14 MED ORDER — SENNOSIDES-DOCUSATE SODIUM 8.6-50 MG PO TABS
2.0000 | ORAL_TABLET | ORAL | Status: DC
  Administered 2021-02-15 – 2021-02-16 (×2): 2 via ORAL
  Filled 2021-02-14 (×2): qty 2

## 2021-02-14 MED ORDER — MORPHINE SULFATE (PF) 0.5 MG/ML IJ SOLN
INTRAMUSCULAR | Status: AC
  Filled 2021-02-14: qty 10

## 2021-02-14 MED ORDER — PHENYLEPHRINE HCL-NACL 20-0.9 MG/250ML-% IV SOLN
INTRAVENOUS | Status: DC | PRN
  Administered 2021-02-14: 60 ug/min via INTRAVENOUS

## 2021-02-14 MED ORDER — OXYCODONE HCL 5 MG PO TABS: 5.0000 mg | ORAL_TABLET | Freq: Once | ORAL | Status: DC | PRN

## 2021-02-14 MED ORDER — CEFAZOLIN IN SODIUM CHLORIDE 3-0.9 GM/100ML-% IV SOLN
INTRAVENOUS | Status: AC
  Filled 2021-02-14: qty 100

## 2021-02-14 MED ORDER — FENTANYL CITRATE (PF) 100 MCG/2ML IJ SOLN
INTRAMUSCULAR | Status: DC | PRN
  Administered 2021-02-14: 15 ug via INTRATHECAL

## 2021-02-14 MED ORDER — COCONUT OIL OIL: 1.0000 "application " | TOPICAL_OIL | Status: DC | PRN

## 2021-02-14 MED ORDER — CEFAZOLIN IN SODIUM CHLORIDE 3-0.9 GM/100ML-% IV SOLN
3.0000 g | INTRAVENOUS | Status: AC
  Administered 2021-02-14: 3 g via INTRAVENOUS

## 2021-02-14 MED ORDER — SODIUM CHLORIDE 0.9% FLUSH: 3.0000 mL | INTRAVENOUS | Status: DC | PRN

## 2021-02-14 MED ORDER — ZOLPIDEM TARTRATE 5 MG PO TABS: 5.0000 mg | ORAL_TABLET | Freq: Every evening | ORAL | Status: DC | PRN

## 2021-02-14 MED ORDER — NALOXONE HCL 0.4 MG/ML IJ SOLN: 0.4000 mg | INTRAMUSCULAR | Status: DC | PRN

## 2021-02-14 MED ORDER — TETANUS-DIPHTH-ACELL PERTUSSIS 5-2.5-18.5 LF-MCG/0.5 IM SUSY: 0.5000 mL | PREFILLED_SYRINGE | Freq: Once | INTRAMUSCULAR | Status: DC

## 2021-02-14 MED ORDER — MEPERIDINE HCL 25 MG/ML IJ SOLN: 6.2500 mg | INTRAMUSCULAR | Status: DC | PRN

## 2021-02-14 MED ORDER — KETOROLAC TROMETHAMINE 30 MG/ML IJ SOLN
30.0000 mg | Freq: Four times a day (QID) | INTRAMUSCULAR | Status: AC
  Administered 2021-02-14 – 2021-02-15 (×2): 30 mg via INTRAVENOUS
  Filled 2021-02-14 (×3): qty 1

## 2021-02-14 MED ORDER — ONDANSETRON HCL 4 MG/2ML IJ SOLN: 4.0000 mg | Freq: Three times a day (TID) | INTRAMUSCULAR | Status: DC | PRN

## 2021-02-14 MED ORDER — IBUPROFEN 600 MG PO TABS
600.0000 mg | ORAL_TABLET | Freq: Four times a day (QID) | ORAL | Status: DC
  Administered 2021-02-15 – 2021-02-16 (×3): 600 mg via ORAL
  Filled 2021-02-14 (×3): qty 1

## 2021-02-14 MED ORDER — MORPHINE SULFATE (PF) 0.5 MG/ML IJ SOLN
INTRAMUSCULAR | Status: DC | PRN
  Administered 2021-02-14: .15 mg via INTRATHECAL

## 2021-02-14 MED ORDER — ACETAMINOPHEN 500 MG PO TABS
1000.0000 mg | ORAL_TABLET | Freq: Four times a day (QID) | ORAL | Status: DC
  Administered 2021-02-14 – 2021-02-16 (×5): 1000 mg via ORAL
  Filled 2021-02-14 (×6): qty 2

## 2021-02-14 SURGICAL SUPPLY — 36 items
APL SKNCLS STERI-STRIP NONHPOA (GAUZE/BANDAGES/DRESSINGS) ×1
BENZOIN TINCTURE PRP APPL 2/3 (GAUZE/BANDAGES/DRESSINGS) ×1 IMPLANT
CLAMP CORD UMBIL (MISCELLANEOUS) IMPLANT
CLOSURE STERI STRIP 1/2 X4 (GAUZE/BANDAGES/DRESSINGS) ×1 IMPLANT
CLOTH BEACON ORANGE TIMEOUT ST (SAFETY) ×2 IMPLANT
DRSG OPSITE POSTOP 4X10 (GAUZE/BANDAGES/DRESSINGS) ×2 IMPLANT
ELECT REM PT RETURN 9FT ADLT (ELECTROSURGICAL) ×2 IMPLANT
ELECTRODE REM PT RTRN 9FT ADLT (ELECTROSURGICAL) ×1 IMPLANT
EXTRACTOR VACUUM M CUP 4 TUBE (SUCTIONS) IMPLANT
GLOVE BIO SURGEON STRL SZ 6.5 (GLOVE) ×2 IMPLANT
GLOVE BIOGEL PI IND STRL 7.0 (GLOVE) ×2 IMPLANT
GLOVE BIOGEL PI INDICATOR 7.0 (GLOVE) ×2
GOWN STRL REUS W/TWL LRG LVL3 (GOWN DISPOSABLE) ×4 IMPLANT
KIT ABG SYR 3ML LUER SLIP (SYRINGE) IMPLANT
NDL HYPO 25X5/8 SAFETYGLIDE (NEEDLE) IMPLANT
NEEDLE HYPO 22GX1.5 SAFETY (NEEDLE) IMPLANT
NEEDLE HYPO 25X5/8 SAFETYGLIDE (NEEDLE) IMPLANT
NS IRRIG 1000ML POUR BTL (IV SOLUTION) ×2 IMPLANT
PACK C SECTION WH (CUSTOM PROCEDURE TRAY) ×2 IMPLANT
PAD OB MATERNITY 4.3X12.25 (PERSONAL CARE ITEMS) ×2 IMPLANT
PENCIL SMOKE EVAC W/HOLSTER (ELECTROSURGICAL) ×2 IMPLANT
STRIP CLOSURE SKIN 1/2X4 (GAUZE/BANDAGES/DRESSINGS) ×1 IMPLANT
SUT MON AB 4-0 PS1 27 (SUTURE) ×2 IMPLANT
SUT PLAIN 0 NONE (SUTURE) IMPLANT
SUT PLAIN 2 0 XLH (SUTURE) IMPLANT
SUT VIC AB 0 CT1 36 (SUTURE) ×4 IMPLANT
SUT VIC AB 0 CTX 36 (SUTURE) ×4
SUT VIC AB 0 CTX36XBRD ANBCTRL (SUTURE) ×2 IMPLANT
SUT VIC AB 2-0 CT1 27 (SUTURE) ×2
SUT VIC AB 2-0 CT1 TAPERPNT 27 (SUTURE) ×1 IMPLANT
SUT VIC AB 3-0 SH 27 (SUTURE) ×2
SUT VIC AB 3-0 SH 27X BRD (SUTURE) IMPLANT
SYR CONTROL 10ML LL (SYRINGE) IMPLANT
TOWEL OR 17X24 6PK STRL BLUE (TOWEL DISPOSABLE) ×2 IMPLANT
TRAY FOLEY W/BAG SLVR 14FR LF (SET/KITS/TRAYS/PACK) IMPLANT
WATER STERILE IRR 1000ML POUR (IV SOLUTION) ×2 IMPLANT

## 2021-02-14 NOTE — Transfer of Care (Signed)
Immediate Anesthesia Transfer of Care Note  Patient: Carol Webb  Procedure(s) Performed: REPEAT CESAREAN SECTION WITH BILATERAL TUBAL LIGATION (Bilateral Abdomen)  Patient Location: PACU  Anesthesia Type:Spinal  Level of Consciousness: awake  Airway & Oxygen Therapy: Patient Spontanous Breathing  Post-op Assessment: Report given to RN  Post vital signs: Reviewed and stable  Last Vitals:  Vitals Value Taken Time  BP 110/81 02/14/21 1615  Temp    Pulse 68 02/14/21 1617  Resp 18 02/14/21 1617  SpO2 96 % 02/14/21 1617  Vitals shown include unvalidated device data.  Last Pain:  Vitals:   02/14/21 1253  TempSrc: Oral         Complications: No complications documented.

## 2021-02-14 NOTE — Anesthesia Postprocedure Evaluation (Signed)
Anesthesia Post Note  Patient: Preeya S Hickam  Procedure(s) Performed: REPEAT CESAREAN SECTION WITH BILATERAL TUBAL LIGATION (Bilateral Abdomen)     Patient location during evaluation: PACU Anesthesia Type: Spinal Level of consciousness: oriented and awake and alert Pain management: pain level controlled Vital Signs Assessment: post-procedure vital signs reviewed and stable Respiratory status: spontaneous breathing, respiratory function stable and nonlabored ventilation Cardiovascular status: blood pressure returned to baseline and stable Postop Assessment: no headache, no backache, no apparent nausea or vomiting and spinal receding Anesthetic complications: no   No complications documented.  Last Vitals:  Vitals:   02/14/21 1653 02/14/21 1654  BP:    Pulse: 68 60  Resp: 17 16  Temp:    SpO2: 96% 95%    Last Pain:  Vitals:   02/14/21 1615  TempSrc: Oral   Pain Goal:    LLE Motor Response: Purposeful movement (02/14/21 1645) LLE Sensation: Decreased (02/14/21 1645) RLE Motor Response: Purposeful movement (02/14/21 1645) RLE Sensation: Decreased (02/14/21 1645)     Epidural/Spinal Function Cutaneous sensation: Able to Discern Pressure (02/14/21 1630), Patient able to flex knees: Yes (02/14/21 1630), Patient able to lift hips off bed: No (02/14/21 1630), Back pain beyond tenderness at insertion site: No (02/14/21 1630), Progressively worsening motor and/or sensory loss: No (02/14/21 1630), Bowel and/or bladder incontinence post epidural: No (02/14/21 1630)  Lucretia Kern

## 2021-02-14 NOTE — Brief Op Note (Signed)
02/14/2021  4:07 PM  PATIENT:  Carol Webb  40 y.o. female  PRE-OPERATIVE DIAGNOSIS:  Previous Cesarean Section, Obesity, DESIRES STERILIZATION  POST-OPERATIVE DIAGNOSIS:  Previous Cesarean Section, Obesity, DESIRES STERILIZATION  PROCEDURE:  Procedure(s) with comments: REPEAT CESAREAN SECTION WITH BILATERAL TUBAL LIGATION (Bilateral) - EDD: 02/20/21  Repeat cesarean section, low transverse cesarean section, bilateral salpingectomy  SURGEON:  Surgeon(s) and Role:    Noland Fordyce, MD - Primary  PHYSICIAN ASSISTANT:   ASSISTANTS: Reina Fuse, CNM  ANESTHESIA:   spinal  EBL:   Per nursing notes  BLOOD ADMINISTERED:none  DRAINS: Urinary Catheter (Foley)   LOCAL MEDICATIONS USED:  NONE  SPECIMEN:  Source of Specimen:  Placenta , bilateral tubal segments DISPOSITION OF SPECIMEN:  Presented to labor and delivery for disposal, bilateral tubal segments to pathology  COUNTS:  YES  TOURNIQUET:  * No tourniquets in log *  DICTATION: .Note written in EPIC  PLAN OF CARE: Admit to inpatient   PATIENT DISPOSITION:  PACU - hemodynamically stable.   Delay start of Pharmacological VTE agent (>24hrs) due to surgical blood loss or risk of bleeding: yes

## 2021-02-14 NOTE — Anesthesia Preprocedure Evaluation (Signed)
Anesthesia Evaluation  Patient identified by MRN, date of birth, ID band Patient awake    Reviewed: Allergy & Precautions, H&P , NPO status , Patient's Chart, lab work & pertinent test results  History of Anesthesia Complications Negative for: history of anesthetic complications  Airway Mallampati: II  TM Distance: >3 FB Neck ROM: full    Dental no notable dental hx.    Pulmonary neg pulmonary ROS, former smoker,    Pulmonary exam normal        Cardiovascular negative cardio ROS Normal cardiovascular exam     Neuro/Psych negative neurological ROS  negative psych ROS   GI/Hepatic negative GI ROS, Neg liver ROS,   Endo/Other  Morbid obesity  Renal/GU negative Renal ROS  negative genitourinary   Musculoskeletal   Abdominal   Peds  Hematology negative hematology ROS (+)   Anesthesia Other Findings  Prior C/S x1  Reproductive/Obstetrics (+) Pregnancy                             Anesthesia Physical Anesthesia Plan  ASA: II  Anesthesia Plan: Spinal   Post-op Pain Management:    Induction:   PONV Risk Score and Plan: Ondansetron and Treatment may vary due to age or medical condition  Airway Management Planned:   Additional Equipment:   Intra-op Plan:   Post-operative Plan:   Informed Consent: I have reviewed the patients History and Physical, chart, labs and discussed the procedure including the risks, benefits and alternatives for the proposed anesthesia with the patient or authorized representative who has indicated his/her understanding and acceptance.       Plan Discussed with:   Anesthesia Plan Comments:         Anesthesia Quick Evaluation

## 2021-02-14 NOTE — Op Note (Signed)
02/14/2021  4:07 PM  PATIENT:  Carol Webb  40 y.o. female  PRE-OPERATIVE DIAGNOSIS:  Previous Cesarean Section, Obesity, DESIRES STERILIZATION  POST-OPERATIVE DIAGNOSIS:  Previous Cesarean Section, Obesity, DESIRES STERILIZATION  PROCEDURE:  Procedure(s) with comments: REPEAT CESAREAN SECTION WITH BILATERAL TUBAL LIGATION (Bilateral) - EDD: 02/20/21  Repeat cesarean section, low transverse cesarean section, bilateral salpingectomy  SURGEON:  Surgeon(s) and Role:    Noland Fordyce, MD - Primary  PHYSICIAN ASSISTANT:   ASSISTANTS: Reina Fuse, CNM  ANESTHESIA:   spinal  EBL:   Per nursing notes  BLOOD ADMINISTERED:none  DRAINS: Urinary Catheter (Foley)   LOCAL MEDICATIONS USED:  NONE  SPECIMEN:  Source of Specimen:  Placenta , bilateral tubal segments DISPOSITION OF SPECIMEN:  Presented to labor and delivery for disposal, bilateral tubal segments to pathology  COUNTS:  YES  TOURNIQUET:  * No tourniquets in log *  DICTATION: .Note written in EPIC  PLAN OF CARE: Admit to inpatient   PATIENT DISPOSITION:  PACU - hemodynamically stable.   Delay start of Pharmacological VTE agent (>24hrs) due to surgical blood loss or risk of bleeding: yes     Findings:  @BABYSEXEBC @ infant,  APGAR (1 MIN):   APGAR (5 MINS):   APGAR (10 MINS):   Normal uterus, tubes and ovaries, normal placenta. 3VC, clear amniotic fluid  EBL: Per nursing notes cc Antibiotics: 3 g Ancef Complications: none  Indications: This is a 40 y.o. year-old, G4 P1-0-2-1 at [redacted]w[redacted]d admitted for repeat cesarean section and sterilization. Risks benefits and alternatives of the procedure were discussed with the patient who agreed to proceed  Procedure:  After informed consent was obtained the patient was taken to the operating room where spinal anesthesia was initiated.  She was prepped and draped in the normal sterile fashion in dorsal supine position with a leftward tilt.  A foley catheter was in place.  A  Pfannenstiel skin incision was made 2 cm above the pubic symphysis in the midline with the scalpel over the old Pfannenstiel scar.  dissection was carried down with the Bovie cautery until the fascia was reached. The fascia was incised in the midline. The incision was extended laterally with the Mayo scissors. The inferior aspect of the fascial incision was grasped with the Coker clamps, elevated up and the underlying rectus muscles were dissected off sharply. The superior aspect of the fascial incision was grasped with the Coker clamps elevated up and the underlying rectus muscles were dissected off sharply.  The peritoneum was entered bluntly. The peritoneal incision was extended superiorly and inferiorly with good visualization of the bladder. The bladder blade was inserted and palpation was done to assess the fetal position and the location of the uterine vessels. The lower segment of the uterus was incised sharply with the scalpel and extended  bluntly in the cephalo-caudal fashion. The infant was grasped, brought to the incision,  rotated and the infant was delivered with fundal pressure. The nose and mouth were bulb suctioned. The cord was clamped and cut after 1 minute delay. The infant was handed off to the waiting pediatrician. The placenta was expressed. The uterus was exteriorized. The uterus was cleared of all clots and debris. The uterine incision was repaired with 0 Vicryl in a running locked fashion.  A second layer of the same suture was used in an imbricating fashion to obtain excellent hemostasis.  An additional figure-of-eight suture was placed in the lower aspect of the incision on the right angle.   We  then confirmed again with the patient her desire for sterilization and made a plan for bilateral salpingectomy.  A packing was placed along the uterine incision. The mid isthmic portion of the right tube was grasped with a Babcock and elevated up.  A window was created in the mesosalpinx with  the Bovie cautery.  A Luann Aspinwall clamp was placed along the mesosalpinx from the fimbriated end through this window. A second Rayden Scheper clamp was placed from this window along the mesosalpinx tracking medially along the mesosalpinx and across the proximal fallopian tube just lateral to the cornua.  Bovie cautery were used to transect the fallopian tube.  Free ties were used on the pedicles held by the [facility] -  clamps.  Good hemostasis was noted.  An identical procedure was carried out on the contralateral side.  After the uterus was returned to the abdomen, and after the gutters were cleared, the pedicles were reevaluated.  Bleeding was noted from the proximal right pedicle.  The suture was slipping.  The mesosalpinx and proximal tubal segment were blasts with a right angle clamp and suture ligated.  All pedicles were again checked and found to be hemostatic.  The uterine incision was reinspected and found to be hemostatic. The peritoneum was grasped and closed with 2-0 Vicryl in a running fashion. The cut muscle edges and the underside of the fascia were inspected and found to be hemostatic. The fascia was closed with 0 Vicryl in 2 halves. The subcutaneous tissue was irrigated. Scarpa's layer was closed with a 2-0 plain gut suture. The skin was closed with a 4-0 Monocryl in a single layer. The patient tolerated the procedure well. Sponge lap and needle counts were correct x3 and patient was taken to the recovery room in a stable condition.  Lendon Colonel 02/14/2021 4:09 PM

## 2021-02-14 NOTE — Anesthesia Procedure Notes (Signed)
Spinal  Patient location during procedure: OR Reason for block: surgical anesthesia Staffing Performed: anesthesiologist  Anesthesiologist: Chantele Corado E, MD Preanesthetic Checklist Completed: patient identified, IV checked, risks and benefits discussed, surgical consent, monitors and equipment checked, pre-op evaluation and timeout performed Spinal Block Patient position: sitting Prep: DuraPrep and site prepped and draped Patient monitoring: continuous pulse ox, blood pressure and heart rate Approach: midline Location: L3-4 Injection technique: single-shot Needle Needle type: Pencan  Needle gauge: 24 G Needle length: 9 cm Assessment Events: CSF return Additional Notes Functioning IV was confirmed and monitors were applied. Sterile prep and drape, including hand hygiene and sterile gloves were used. The patient was positioned and the spine was prepped. The skin was anesthetized with lidocaine.  Free flow of clear CSF was obtained prior to injecting local anesthetic into the CSF. The needle was carefully withdrawn. The patient tolerated the procedure well.     

## 2021-02-15 ENCOUNTER — Encounter (HOSPITAL_COMMUNITY): Payer: Self-pay | Admitting: Obstetrics

## 2021-02-15 LAB — CBC
HCT: 33.5 % — ABNORMAL LOW (ref 36.0–46.0)
Hemoglobin: 10.9 g/dL — ABNORMAL LOW (ref 12.0–15.0)
MCH: 30.8 pg (ref 26.0–34.0)
MCHC: 32.5 g/dL (ref 30.0–36.0)
MCV: 94.6 fL (ref 80.0–100.0)
Platelets: 213 10*3/uL (ref 150–400)
RBC: 3.54 MIL/uL — ABNORMAL LOW (ref 3.87–5.11)
RDW: 14.6 % (ref 11.5–15.5)
WBC: 10.4 10*3/uL (ref 4.0–10.5)
nRBC: 0 % (ref 0.0–0.2)

## 2021-02-15 LAB — BIRTH TISSUE RECOVERY COLLECTION (PLACENTA DONATION)

## 2021-02-15 NOTE — Progress Notes (Signed)
POSTOPERATIVE DAY # 1 S/P Elective repeat LTCS and bilateral salpingectomy, baby girl "Maisie"    S:         Reports feeling overall well, but reports some mild upper abdominal pain that radiates across abdomen, likely from flatus             Tolerating po intake / no nausea / no vomiting / no flatus / no BM  Denies dizziness, SOB, or CP             Bleeding is moderate             Pain controlled withToradol and Tylenol             Up ad lib / ambulatory/ void x 1 since foley removal   Newborn breast feeding - going well, baby is latching well   O:  VS: BP 123/70   Pulse 65   Temp 98.3 F (36.8 C) (Oral)   Resp 16   Ht 5\' 7"  (1.702 m)   Wt 132.5 kg   SpO2 99%   Breastfeeding Unknown   BMI 45.73 kg/m   Patient Vitals for the past 24 hrs:  BP Temp Temp src Pulse Resp SpO2 Height Weight  02/15/21 0849 123/70 98.3 F (36.8 C) Oral 65 16 99 % -- --  02/15/21 0415 118/65 98 F (36.7 C) Oral 60 16 -- -- --  02/15/21 0030 125/67 98.4 F (36.9 C) Oral 63 18 -- -- --  02/14/21 2030 125/85 97.7 F (36.5 C) Oral 84 18 -- -- --  02/14/21 1930 130/80 97.7 F (36.5 C) -- 60 16 -- -- --  02/14/21 1835 (!) 142/83 97.7 F (36.5 C) Oral (!) 58 18 98 % -- --  02/14/21 1720 (!) 142/82 97.9 F (36.6 C) Oral 66 18 98 % -- --  02/14/21 1702 -- -- -- 85 19 98 % -- --  02/14/21 1701 -- -- -- 72 15 96 % -- --  02/14/21 1700 126/81 -- -- 68 15 99 % -- --  02/14/21 1659 -- -- -- 73 13 98 % -- --  02/14/21 1658 -- -- -- 73 14 98 % -- --  02/14/21 1657 -- -- -- 66 15 97 % -- --  02/14/21 1656 -- -- -- 69 12 96 % -- --  02/14/21 1655 -- -- -- 67 12 96 % -- --  02/14/21 1654 -- -- -- 60 16 95 % -- --  02/14/21 1653 -- -- -- 68 17 96 % -- --  02/14/21 1652 -- -- -- 71 18 95 % -- --  02/14/21 1651 -- -- -- 70 15 97 % -- --  02/14/21 1650 -- -- -- 69 19 95 % -- --  02/14/21 1649 -- -- -- 74 12 97 % -- --  02/14/21 1648 -- -- -- 72 13 98 % -- --  02/14/21 1647 -- -- -- 75 15 97 % -- --  02/14/21  1646 -- -- -- 68 16 96 % -- --  02/14/21 1645 107/81 -- -- 77 16 97 % -- --  02/14/21 1643 -- -- -- 76 (!) 9 96 % -- --  02/14/21 1642 -- -- -- 80 15 95 % -- --  02/14/21 1641 -- -- -- 84 20 94 % -- --  02/14/21 1640 -- -- -- 89 19 98 % -- --  02/14/21 1639 -- -- -- 73 (!) 21 94 % -- --  02/14/21 1638 -- -- --  72 20 96 % -- --  02/14/21 1637 -- -- -- 74 (!) 22 97 % -- --  02/14/21 1636 -- -- -- (!) 38 (!) 23 98 % -- --  02/14/21 1635 -- -- -- 62 14 98 % -- --  02/14/21 1634 -- -- -- 67 17 97 % -- --  02/14/21 1633 -- -- -- 68 13 96 % -- --  02/14/21 1632 -- -- -- (!) 34 15 95 % -- --  02/14/21 1631 -- -- -- 69 16 95 % -- --  02/14/21 1630 134/70 -- -- 66 (!) 9 95 % -- --  02/14/21 1629 -- -- -- 72 14 95 % -- --  02/14/21 1628 -- -- -- 68 15 97 % -- --  02/14/21 1627 -- -- -- 77 16 95 % -- --  02/14/21 1626 -- -- -- 72 11 96 % -- --  02/14/21 1625 -- -- -- 63 18 96 % -- --  02/14/21 1624 -- -- -- 68 13 95 % -- --  02/14/21 1623 -- -- -- 76 10 96 % -- --  02/14/21 1622 -- -- -- 81 12 97 % -- --  02/14/21 1621 -- -- -- 80 18 96 % -- --  02/14/21 1620 -- -- -- 75 20 97 % -- --  02/14/21 1619 -- -- -- 76 (!) 21 97 % -- --  02/14/21 1618 -- -- -- 67 16 97 % -- --  02/14/21 1617 -- -- -- 68 18 96 % -- --  02/14/21 1616 -- -- -- 67 13 97 % -- --  02/14/21 1615 110/81 98 F (36.7 C) Oral 67 -- 96 % -- --  02/14/21 1253 (!) 143/96 98.6 F (37 C) Oral (!) 110 17 96 % 5\' 7"  (1.702 m) 132.5 kg     LABS:              Recent Labs    02/12/21 1049 02/15/21 0519  WBC 8.8 10.4  HGB 12.6 10.9*  PLT 237 213               Bloodtype: --/--/O POS (04/18 1049)  Rubella: Immune (08/20 0000)                                             I&O: Intake/Output      04/20 0701 04/21 0700 04/21 0701 04/22 0700   I.V. (mL/kg) 1800 (13.6)    Total Intake(mL/kg) 1800 (13.6)    Urine (mL/kg/hr) 625 1600 (3.4)   Blood 388    Total Output 1013 1600   Net +787 -1600                     Physical  Exam:             Alert and Oriented X3  Lungs: Clear and unlabored  Heart: regular rate and rhythm / no murmurs  Abdomen: soft, non-tender, moderate gaseous distention, active bowel sounds on left, hypoactive on right             Fundus: firm, non-tender, U-3             Dressing: honeycomb dressing with steri-strips, a few small spots with old drainage, but otherwise dry and intact              Incision:  approximated  with sutures / no erythema / no ecchymosis / no drainage  Perineum: intact  Lochia: appropriate   Extremities: +1 pedal edema, no calf pain or tenderness  A/P:     POD # 1 S/P Repeat LTCS and bilateral salpingectomy             ABL Anemia     - plan for oral FE daily when bowel function improves  Hx. Of GHTN with last pregnancy, but no htn this pregnancy   - continue to watch BPs PP  Lactation support PRN  Routine postoperative care              Ambulation and warm liquids encouraged to promote bowel motility   Desires early d/c home tomorrow   Carlean Jews, MSN, CNM [facility]

## 2021-02-16 DIAGNOSIS — O9902 Anemia complicating childbirth: Secondary | ICD-10-CM

## 2021-02-16 LAB — SURGICAL PATHOLOGY

## 2021-02-16 MED ORDER — SENNOSIDES-DOCUSATE SODIUM 8.6-50 MG PO TABS: 2.0000 | ORAL_TABLET | ORAL | Status: DC

## 2021-02-16 MED ORDER — POLYSACCHARIDE IRON COMPLEX 150 MG PO CAPS: 150.0000 mg | ORAL_CAPSULE | Freq: Every day | ORAL | Status: AC

## 2021-02-16 MED ORDER — COCONUT OIL OIL: 1.0000 "application " | TOPICAL_OIL | 0 refills | Status: AC | PRN

## 2021-02-16 MED ORDER — DIBUCAINE (PERIANAL) 1 % EX OINT: 1.0000 "application " | TOPICAL_OINTMENT | CUTANEOUS | Status: DC | PRN

## 2021-02-16 MED ORDER — IBUPROFEN 600 MG PO TABS: 600.0000 mg | ORAL_TABLET | Freq: Four times a day (QID) | ORAL | 0 refills | Status: DC

## 2021-02-16 MED ORDER — OXYCODONE HCL 5 MG PO TABS: 5.0000 mg | ORAL_TABLET | ORAL | 0 refills | Status: AC | PRN

## 2021-02-16 MED ORDER — MAGNESIUM OXIDE 400 240 MG PO PACK: 400.0000 mg | PACK | Freq: Every day | ORAL | Status: DC

## 2021-02-16 NOTE — Discharge Instructions (Signed)
Lactation outpatient support - home visit  Linda Coppola RN, MHA, IBCLC at [facility]: Lactation Consultant  [email or web address] Mail: [email or web address] Tel: [phone]    Additional resources:  [facility] [email or web address]   Chiropractic specialist   Dr. Leanna Hastings [email or web address]  Craniosacral therapy for baby  Erin Balkind  [email or web address]  

## 2021-02-16 NOTE — Discharge Summary (Addendum)
OB Discharge Summary  Patient Name: Carol Webb DOB: 02/19/1981 MRN: [id number]  Date of admission: 02/14/2021 Delivering provider: Noland Fordyce   Admitting diagnosis: Previous cesarean section [Z98.891] S/P repeat low transverse C-section [V25.366] Intrauterine pregnancy: [redacted]w[redacted]d     Secondary diagnosis: Patient Active Problem List   Diagnosis Date Noted  . Maternal anemia, with delivery ABL 02/16/2021  . Previous cesarean section 02/14/2021  . S/P repeat low transverse C-section 02/14/2021  . Postpartum care following C/S delivery and BTL (4/20) 11/25/2018   Additional problems:None   Date of discharge: 02/16/2021   Discharge diagnosis: Principal Problem:   Postpartum care following C/S delivery and BTL (4/20) Active Problems:   Previous cesarean section   S/P repeat low transverse C-section   Maternal anemia, with delivery ABL                                                              Post partum procedures:none  Augmentation: N/A Pain control: Spinal  Laceration:None  Episiotomy:None  Complications: None  Hospital course:  Sceduled C/S   40 y.o. yo [id number] at [redacted]w[redacted]d was admitted to the hospital 02/14/2021 for scheduled cesarean section with the following indication:Elective Repeat with bilateral salpingectomy.Delivery details are as follows:  Membrane Rupture Time/Date: 3:10 PM ,02/14/2021   Delivery Method:C-Section, Low Transverse  Details of operation can be found in separate operative note.  Patient had an uncomplicated postpartum course.  She is ambulating, tolerating a regular diet, and urinating well.  Patient is discharged home in stable condition on  02/16/21        Newborn Data: Birth date:02/14/2021  Birth time:3:11 PM  Gender:Female  Living status:Living  Apgars:9 ,9  Weight:3360 g     Physical exam  Vitals:   02/15/21 0849 02/15/21 1320 02/15/21 2216 02/16/21 0551  BP: 123/70 121/77 125/76 116/77  Pulse: 65 63 71 86  Resp: 16 17 18 18   Temp:  98.3 F (36.8 C) 97.7 F (36.5 C) 98 F (36.7 C) 97.7 F (36.5 C)  TempSrc: Oral Oral Oral Oral  SpO2: 99% 98% 98% 100%  Weight:      Height:       General: alert, cooperative and no distress Lochia: minimal without clots. Uterine Fundus: firm below umbilicus  Incision: Healing well with minimal drainage.  Perineum: intact. DVT Evaluation: minimal non-pitting edema. No signs of DVT.  Labs: Lab Results  Component Value Date   WBC 10.4 02/15/2021   HGB 10.9 (L) 02/15/2021   HCT 33.5 (L) 02/15/2021   MCV 94.6 02/15/2021   PLT 213 02/15/2021   CMP Latest Ref Rng & Units 11/25/2018  Glucose 70 - 99 mg/dL -  BUN 6 - 20 mg/dL -  Creatinine 11/27/2018 - 2.59 mg/dL 5.63  Sodium 8.75 - 643 mmol/L -  Potassium 3.5 - 5.1 mmol/L -  Chloride 98 - 111 mmol/L -  CO2 22 - 32 mmol/L -  Calcium 8.9 - 10.3 mg/dL -  Total Protein 6.5 - 8.1 g/dL -  Total Bilirubin 0.3 - 1.2 mg/dL -  Alkaline Phos 38 - 329 U/L -  AST 15 - 41 U/L -  ALT 0 - 44 U/L -   Edinburgh Postnatal Depression Scale Screening Tool 02/15/2021 11/26/2018 11/26/2018  I have been able to laugh and see the  funny side of things. 0 0 (No Data)  I have looked forward with enjoyment to things. 0 0 -  I have blamed myself unnecessarily when things went wrong. 1 0 -  I have been anxious or worried for no good reason. 2 0 -  I have felt scared or panicky for no good reason. 0 0 -  Things have been getting on top of me. 1 0 -  I have been so unhappy that I have had difficulty sleeping. 0 0 -  I have felt sad or miserable. 0 0 -  I have been so unhappy that I have been crying. 0 0 -  The thought of harming myself has occurred to me. 0 0 -  Edinburgh Postnatal Depression Scale Total 4 0 -   Vaccines: TDaP          UTD                    COVID-19 UTD  Discharge instruction:  Oral Iron tablets at home for acute anemia with Mag Oxide for gut motility, to continue at home and instructions given at bedside. Other discharge instructions given  per After Visit Summary,  [facility] booklet and  "Understanding Mother [facility] Baby [facility]" hospital booklet  After Visit Meds:  Allergies as of 02/16/2021   No Known Allergies     Medication List    STOP taking these medications   aspirin EC 81 MG tablet     TAKE these medications   cholecalciferol 1000 units tablet Commonly known as: VITAMIN D Take 1,000 Units by mouth daily.   coconut oil Oil Apply 1 application topically as needed.   dibucaine 1 % Oint Commonly known as: NUPERCAINAL Place 1 application rectally as needed for hemorrhoids.   folic acid 800 MCG tablet Commonly known as: FOLVITE Take 400 mcg by mouth daily.   ibuprofen 600 MG tablet Commonly known as: ADVIL Take 1 tablet (600 mg total) by mouth every 6 (six) hours.   iron polysaccharides 150 MG capsule Commonly known as: Ferrex 150 Take 1 capsule (150 mg total) by mouth daily.   Magnesium Oxide 400 240 MG Pack Generic drug: Magnesium Oxide Take 400 mg by mouth daily.   oxyCODONE 5 MG immediate release tablet Commonly known as: Oxy IR/ROXICODONE Take 1-2 tablets (5-10 mg total) by mouth every 4 (four) hours as needed for up to 7 days for moderate pain.   prenatal multivitamin Tabs tablet Take 1 tablet by mouth daily at 12 noon.   senna-docusate 8.6-50 MG tablet Commonly known as: Senokot-S Take 2 tablets by mouth daily.       Diet: regular diet  Activity: Advance as tolerated. Pelvic rest for 6 weeks.   Postpartum contraception: Not Discussed  Newborn Data: Live born female  Birth Weight: 7 lb 6.5 oz (3360 g) APGAR: 9, 9  Newborn Delivery   Birth date/time: 02/14/2021 15:11:00 Delivery type: C-Section, Low Transverse Trial of labor: No C-section categorization: Repeat      named "Maisie" Baby Feeding: Breast Disposition:home with mother    Delivery Report:   Review the Delivery Report for details.    Follow up:  Follow-up Information    Noland Fordyce, MD. Schedule an  appointment as soon as possible for a visit in 6 week(s).   Specialty: Obstetrics and Gynecology Contact information: [address] [place] [address] [phone]                 Signed: Warrick Parisian (  Dorathy Daft, BSN, RNC-OB  Student Nurse-Midwife   02/16/2021  10:44 AM

## 2021-03-20 ENCOUNTER — Institutional Professional Consult (permissible substitution): Payer: Self-pay | Admitting: Neurology

## 2021-05-16 ENCOUNTER — Ambulatory Visit (INDEPENDENT_AMBULATORY_CARE_PROVIDER_SITE_OTHER): Payer: BC Managed Care – PPO | Admitting: Neurology

## 2021-05-16 ENCOUNTER — Encounter: Payer: Self-pay | Admitting: Neurology

## 2021-05-16 ENCOUNTER — Other Ambulatory Visit: Payer: Self-pay | Admitting: Neurology

## 2021-05-16 VITALS — BP 120/73 | HR 62 | Ht 67.0 in | Wt 271.0 lb

## 2021-05-16 DIAGNOSIS — M2619 Other specified anomalies of jaw-cranial base relationship: Secondary | ICD-10-CM | POA: Diagnosis not present

## 2021-05-16 DIAGNOSIS — R0683 Snoring: Secondary | ICD-10-CM

## 2021-05-16 NOTE — Progress Notes (Signed)
SLEEP MEDICINE CLINIC    Provider:  Melvyn Novas, MD  Primary Care Physician:  Pcp, No No address on file     Referring Provider: Noland Fordyce, Md [address] [place],  [place] [address]          Chief Complaint according to patient   Patient presents with:     New Patient (Initial Visit)     Here for evaluation for excessive snoring and possible sleep apnea. Referred by Dr Ernestina Penna. Pt just had a baby April 20th. Baby has been sleeping trough thenight past 4 weeks.       HISTORY OF PRESENT ILLNESS:  Carol Webb is a 40 y.o. year old White or Caucasian female patient seen here as a referral on 05/16/2021 from Dr Ernestina Penna , MD Gynecology-  for a sleep evaluation- .  Chief concern according to patient :scheduled c -sec on April  20th 2022) delivery of a now  98 month old baby at home , who just sleeps through the night. The previous 6 months have been hard- mother feels a lot better now.  Older child is 60 years-old  son and still diapers.    I have the pleasure of seeing Carol Webb today, a right -handed White or Caucasian female with a possible sleep disorder.   The sleep problems have been associated with last trimester of pregnancy - HTN, had previously emergency C section at full term.    Sleep relevant medical history: no Nocturia, HTN in pregnancy.    Family medical /sleep history: no other family member on CPAP with OSA, father is a loud snorer- insomnia, sleep walkers.    Social history:  Patient is on maternity leave as of 05-16-2021, working as a bar tender- and lives in a household with husband and 2 children- toddler and newborn-breastfeeding at this time- Pets are present.  Tobacco use- quit 2017.  ETOH use : some , Caffeine intake in form of Coffee( 3 cups at the most a day  ) Soda( /) Tea ( /) or energy drinks.Regular exercise in form of walking- swimming .        Sleep habits are as follows: The patient's dinner time is between 5-6 PM. The patient  goes to bed at 10.30 PM and will be nursing one last time- then continues to sleep for 6-8 hours, no longer wakes for  bathroom breaks.    The preferred sleep position is variable - she snores- nasal congestion-  with the support of 1-2 pillows.  Dreams are reportedly frequent/vivid. In her pregnancy she always dreamt of her late father.  8  AM is the usual rise time. She reports not feeling refreshed or restored in AM, with symptoms such as dry mouth, and residual fatigue. Naps are taken infrequently, lasting from 20-35 minutes and are more refreshing than nocturnal sleep.    Review of Systems: Out of a complete 14 system review, the patient complains of only the following symptoms, and all other reviewed systems are negative.:  Fatigue, sleepiness , snoring,    How likely are you to doze in the following situations: 0 = not likely, 1 = slight chance, 2 = moderate chance, 3 = high chance   Sitting and Reading? Watching Television? Sitting inactive in a public place (theater or meeting)? As a passenger in a car for an hour without a break? Lying down in the afternoon when circumstances permit? Sitting and talking to someone? Sitting quietly after lunch without  alcohol? In a car, while stopped for a few minutes in traffic?   Total = 3/ 24 points   FSS endorsed at 19/ 63 points.   Social History   Socioeconomic History   Marital status: Married    Spouse name: Not on file   Number of children: 2   Years of education: Not on file   Highest education level: Some college, no degree  Occupational History   Not on file  Tobacco Use   Smoking status: Former    Types: Cigarettes    Quit date: 06/2016    Years since quitting: 4.8   Smokeless tobacco: Never  Vaping Use   Vaping Use: Never used  Substance and Sexual Activity   Alcohol use: Not Currently    Comment: 3-5 drinks a month   Drug use: Yes    Types: Marijuana    Comment: last use Mar 21 2018   Sexual activity: Yes     Birth control/protection: None  Other Topics Concern   Not on file  Social History Narrative   Lives with husband and 2 children   32oz of coffee a day   Right handed   Social Determinants of Health   Financial Resource Strain: Not on file  Food Insecurity: Not on file  Transportation Needs: Not on file  Physical Activity: Not on file  Stress: Not on file  Social Connections: Not on file    Family History  Problem Relation Age of Onset   Atrial fibrillation Mother    Stroke Mother    Colon cancer Father    Prostate cancer Father     Past Medical History:  Diagnosis Date   Medical history non-contributory     Past Surgical History:  Procedure Laterality Date   CESAREAN SECTION N/A 11/25/2018   Procedure: CESAREAN SECTION;  Surgeon: Shea Evans, MD;  Location: [facility];  Service: Obstetrics;  Laterality: N/A;   CESAREAN SECTION WITH BILATERAL TUBAL LIGATION Bilateral 02/14/2021   Procedure: REPEAT CESAREAN SECTION WITH BILATERAL TUBAL LIGATION;  Surgeon: Noland Fordyce, MD;  Location: [facility] LD ORS;  Service: Obstetrics;  Laterality: Bilateral;  EDD: 02/20/21   DIAGNOSTIC LAPAROSCOPY WITH REMOVAL OF ECTOPIC PREGNANCY N/A 03/30/2018   Procedure: DIAGNOSTIC LAPAROSCOPY WITH REMOVAL OF Ruptured Right Ovarian Cyst;  Surgeon: Olivia Mackie, MD;  Location: [facility] ORS;  Service: Gynecology;  Laterality: N/A;   NO PAST SURGERIES       Current Outpatient Medications on File Prior to Visit  Medication Sig Dispense Refill   cholecalciferol (VITAMIN D) 1000 units tablet Take 1,000 Units by mouth daily.     coconut oil OIL Apply 1 application topically as needed.  0   folic acid (FOLVITE) 800 MCG tablet Take 400 mcg by mouth daily.     iron polysaccharides (FERREX 150) 150 MG capsule Take 1 capsule (150 mg total) by mouth daily.     Multiple Minerals (CALCIUM-MAGNESIUM-ZINC) TABS Take 2 tablets by mouth daily. Calcium 500mg , zinc 10mg , mag. 80mg      Prenatal Vit-Fe Fumarate-FA  (PRENATAL MULTIVITAMIN) TABS tablet Take 1 tablet by mouth daily at 12 noon.     No current facility-administered medications on file prior to visit.    No Known Allergies  Physical exam:  Today's Vitals   05/16/21 0941  BP: 120/73  Pulse: 62  Weight: 271 lb (122.9 kg)  Height: 5\' 7"  (1.702 m)   Body mass index is 42.44 kg/m.   Wt Readings from Last 3 Encounters:  05/16/21  271 lb (122.9 kg)  02/14/21 292 lb (132.5 kg)  02/02/21 288 lb (130.6 kg)     Ht Readings from Last 3 Encounters:  05/16/21 5\' 7"  (1.702 m)  02/14/21 5\' 7"  (1.702 m)  02/02/21 5\' 7"  (1.702 m)      General: The patient is awake, alert and appears not in acute distress. The patient is well groomed. Head: Normocephalic, atraumatic. Neck is supple. Mallampati ,1-2   neck circumference:16.5  inches .  Nasal airflow congested - Retrognathia is mild   Dental status: crowded lower jaw, small upper- Cardiovascular:  Regular rate and cardiac rhythm by pulse,  without distended neck veins. Respiratory: Lungs are clear to auscultation.  Skin:  Without evidence of ankle edema, or rash. Trunk: The patient's posture is erect.   Neurologic exam : The patient is awake and alert, oriented to place and time.   Memory subjective described as intact.  Attention span & concentration ability appears normal.  Speech is fluent,  without  dysarthria, dysphonia or aphasia.  Mood and affect are appropriate.   Cranial nerves: no loss of smell or taste reported  Pupils are equal and briskly reactive to light. Funduscopic exam deferred.   Extraocular movements in vertical and horizontal planes were intact and without nystagmus. No Diplopia. Visual fields by finger perimetry are intact. Hearing was intact to soft voice and finger rubbing.    Facial sensation intact to fine touch.  Facial motor strength is symmetric and tongue and uvula move midline.  Neck ROM : rotation, tilt and flexion extension were normal for age and  shoulder shrug was symmetrical.    Motor exam:  Symmetric bulk, tone and ROM.   Normal tone without cog wheeling, symmetric grip strength .   Sensory:  Fine touch and vibration were normal.  Proprioception tested in the upper extremities was normal.   Coordination: Rapid alternating movements in the fingers/hands were of normal speed.  The Finger-to-nose maneuver was intact without evidence of ataxia, dysmetria or tremor.   Gait and station: Patient could rise unassisted from a seated position, walked without assistive device.  Stance is of normal width/ base.  Toe and heel walk were deferred.  Deep tendon reflexes: in the  upper and lower extremities are symmetric and intact.  Babinski response was deferred .        After spending a total time of  35  minutes face to face and additional time for physical and neurologic examination, review of laboratory studies,  personal review of imaging studies, reports and results of other testing and review of referral information / records as far as provided in visit, I have established the following assessments:  1) snoring and nursing- with 2 small children at home. Will check for OSA and will recommend either a dental device or a CPAP depending on degree if apnea is present.   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     My Plan is to proceed with:  1) HST    I would like to thank Noland FordyceFogleman, Kelly, Md [address] [place],  [place] [id number] for allowing me to meet with and to take care of this pleasant patient.   In short, Jeanae S Griffitts is presenting with snoring and fatigue, but not sleepiness- CC: I will share my notes.  Electronically signed by: Melvyn Novasarmen Barnes Florek, MD 05/16/2021 10:00 AM  [facility] and [facility] certified by The [employer] of Sleep Medicine and  Diplomate of the [employer] of [employer]. Board certified In Neurology through the ABPN, Fellow of the  [employer]. Medical Director of [facility].

## 2021-05-16 NOTE — Patient Instructions (Signed)
Screening for Sleep Apnea Sleep apnea is a condition in which breathing pauses or becomes shallow during sleep. Sleep apnea screening is a test to determine if you are at risk for sleep apnea. The test includes a series of questions. It will only takes a few minutes. Your health care provider may ask you to have this test in preparation for surgery or as part of a physical exam. What are the symptoms of sleep apnea? Common symptoms of sleep apnea include: Snoring. Waking up often at night. Daytime sleepiness. Pauses in breathing. Choking or gasping during sleep. Irritability. Forgetfulness. Trouble thinking clearly. Depression. Personality changes. Most people with sleep apnea do not know that they have it. What are the advantages of sleep apnea screening? Getting screened for sleep apnea can help: Ensure your safety. It is important for your health care providers to know whether or not you have sleep apnea, especially if you are having surgery or have other long-term (chronic) health conditions. Improve your health and allow you to get a better night's rest. Restful sleep can help you: Have more energy. Lose weight. Improve high blood pressure. Improve diabetes management. Prevent stroke. Prevent car accidents. What happens during the screening? Screening usually includes being asked a list of questions about your sleep quality. Some questions you may be asked include: Do you snore? Is your sleep restless? Do you have daytime sleepiness? Has a partner or spouse told you that you stop breathing during sleep? Have you had trouble concentrating or memory loss? What is your age? What is your neck circumference? To measure your neck, keep your back straight and gently wrap the tape measure around your neck. Put the tape measure at the middle of your neck, between your chin and collarbone. What is your sex assigned at birth? Do you have or are you being treated for high blood  pressure? If your screening test is positive, you are at risk for the condition. Further testing may be needed to confirm a diagnosis of sleep apnea. Where to find more information You can find screening tools online or at your health care clinic. For more information about sleep apnea screening and healthy sleep, visit these websites: Centers for Disease Control and Prevention: www.cdc.gov American Sleep Apnea Association: www.sleepapnea.org Contact a health care provider if: You think that you may have sleep apnea. Summary Sleep apnea screening can help determine if you are at risk for sleep apnea. It is important for your health care providers to know whether or not you have sleep apnea, especially if you are having surgery or have other chronic health conditions. You may be asked to take a screening test for sleep apnea in preparation for surgery or as part of a physical exam. This information is not intended to replace advice given to you by your health care provider. Make sure you discuss any questions you have with your health care provider. Document Revised: 09/22/2020 Document Reviewed: 09/22/2020 Elsevier Patient Education  2022 Elsevier Inc.  

## 2021-06-06 ENCOUNTER — Ambulatory Visit: Payer: BC Managed Care – PPO | Admitting: Neurology

## 2021-06-06 DIAGNOSIS — G4733 Obstructive sleep apnea (adult) (pediatric): Secondary | ICD-10-CM | POA: Diagnosis not present

## 2021-06-06 DIAGNOSIS — M2619 Other specified anomalies of jaw-cranial base relationship: Secondary | ICD-10-CM

## 2021-06-06 DIAGNOSIS — R0683 Snoring: Secondary | ICD-10-CM

## 2021-06-13 NOTE — Progress Notes (Signed)
    [facility]   HOME SLEEP TEST REPORT ( by Watch PAT)   STUDY DATE:  8-10/ data load 06-13-2021 DOB:  1981/05/30 MRN: [id number]   ORDERING CLINICIAN: Melvyn Novas, MD  REFERRING CLINICIAN: Noland Fordyce, MD    CLINICAL INFORMATION/HISTORY: Carol Webb is a 40 year-old  Caucasian female patient and recent mother ,seen here as a referral on 05/16/2021 from Dr Ernestina Penna , MD Gynecology-  for a sleep evaluation- .  Underwent a scheduled c -sec on April  20th 2022), delivery of a now 7.67 month old baby , who just started sleeping through the night. The previous 6 months have been hard- mother feels a lot better now.  Her eldest  is 32 year-old  son and still in diapers.  The sleep problems have been associated with last trimester of pregnancy - HTN, had previously emergency C section at full term.    Epworth sleepiness score: 3/24.   BMI: 42 kg/m   Neck Circumference: 17"   FINDINGS:   Sleep Summary:   Total Recording Time (hours, min): Total recording time for this home sleep test was 8 hours and 5 minutes with a total sleep time estimated at 6 hours 41 minutes.  Remarkable was a 32% proportion of REM sleep within total sleep time.                          Respiratory Indices:   Calculated pAHI (per hour): The overall apnea-hypopnea index was only 5.6/h in rem sleep accentuated to 9.4/h.  In supine sleep the AHI was 10.6 and in nonsupine 5.4.  The lowest positional dependent AHI was via sleeping on her left side at 2.2/h.                             Oxygen Saturation Statistics:       O2 Saturation Range (%): The lowest oxygen saturation at nadir was 90% with a maximum of 99% and a mean oxygenation of 95%.  There was no time recorded below the 89% 02 saturation                                     Pulse Rate Statistics:   Pulse Range: The patient's pulse range varied between a minimum of 39 and a maximum heart rate of 85 bpm.  Mean heart rate was 5 to 53  bpm.  Please note that this home sleep test can only give information about heart rate but not cardiac rhythm.                IMPRESSION:  This HST confirms the presence of a very mild sleep apnea , manifesting only in REM sleep to clinically barely relevant levels. The patient has the option of reducing the apnea by sleeping on the left side or prone.   RECOMMENDATION: no CPAP intervention is needed.  Positional therapy and weight loss should reduce the AHI to normal.     INTERPRETING PHYSICIAN:   Melvyn Novas, MD   Medical Director of [facility] at [facility].

## 2021-06-18 ENCOUNTER — Telehealth: Payer: Self-pay | Admitting: *Deleted

## 2021-06-18 NOTE — Progress Notes (Signed)
IMPRESSION:  This HST confirms the presence of a very mild sleep apnea , manifesting only in REM sleep to clinically barely relevant levels. The patient has the option of reducing the apnea by sleeping on the left side or prone.  RECOMMENDATION: no CPAP intervention is needed.  Positional therapy and weight loss should reduce the AHI to normal.   Snoring can be treated by a dental device if treatment is wanted. Please ask the patient to let us know if she needs a referral to sleep dentistry?  RV optional.

## 2021-06-18 NOTE — Telephone Encounter (Signed)
Called and spoke w/ pt about results per Dr. Oliva Bustard note. Pt verbalized understanding. She is going to try positional changes/weight loss first. She will hold off on dental device. She prefers to keep October follow up but will call back if she would like to cx.

## 2021-06-18 NOTE — Procedures (Signed)
[facility]   HOME SLEEP TEST REPORT ( by Watch PAT)   STUDY DATE:  8-10/ data load 06-13-2021 DOB:  26-Dec-1980 MRN: [id number]   ORDERING CLINICIAN: Melvyn Novas, MD  REFERRING CLINICIAN: Noland Fordyce, MD    CLINICAL INFORMATION/HISTORY: Carol Webb is a 40 year-old  Caucasian female patient and recent mother ,seen here as a referral on 05/16/2021 from Dr Ernestina Penna , MD Gynecology-  for a sleep evaluation- .  Underwent a scheduled c -sec on April  20th 2022), delivery of a now 5.62 month old baby , who just started sleeping through the night. The previous 6 months have been hard- mother feels a lot better now.  Her eldest  is 31 year-old  son and still in diapers.  The sleep problems have been associated with last trimester of pregnancy - HTN, had previously emergency C section at full term.    Epworth sleepiness score: 3/24.   BMI: 42 kg/m   Neck Circumference: 17"   FINDINGS:   Sleep Summary:   Total Recording Time (hours, min): Total recording time for this home sleep test was 8 hours and 5 minutes with a total sleep time estimated at 6 hours 41 minutes.  Remarkable was a 32% proportion of REM sleep within total sleep time.                          Respiratory Indices:   Calculated pAHI (per hour): The overall apnea-hypopnea index was only 5.6/h in rem sleep accentuated to 9.4/h.  In supine sleep the AHI was 10.6 and in nonsupine 5.4.  The lowest positional dependent AHI was via sleeping on her left side at 2.2/h.                             Oxygen Saturation Statistics:       O2 Saturation Range (%): The lowest oxygen saturation at nadir was 90% with a maximum of 99% and a mean oxygenation of 95%.  There was no time recorded below the 89% 02 saturation                                     Pulse Rate Statistics:   Pulse Range: The patient's pulse range varied between a minimum of 39 and a maximum heart rate of 85 bpm.  Mean heart rate was 5 to 53 bpm.  Please note  that this home sleep test can only give information about heart rate but not cardiac rhythm.                IMPRESSION:  This HST confirms the presence of a very mild sleep apnea , manifesting only in REM sleep to clinically barely relevant levels. The patient has the option of reducing the apnea by sleeping on the left side or prone.   RECOMMENDATION: no CPAP intervention is needed.  Positional therapy and weight loss should reduce the AHI to normal.   Snoring can be treated by a dental device if treatment is wanted.     INTERPRETING PHYSICIAN:   Carol Novas, MD   Medical Director of [facility] at [facility].

## 2021-06-18 NOTE — Telephone Encounter (Signed)
-----   Message from Melvyn Novas, MD sent at 06/18/2021  9:16 AM EDT ----- IMPRESSION:  This HST confirms the presence of a very mild sleep apnea , manifesting only in REM sleep to clinically barely relevant levels. The patient has the option of reducing the apnea by sleeping on the left side or prone.  RECOMMENDATION: no CPAP intervention is needed.  Positional therapy and weight loss should reduce the AHI to normal.   Snoring can be treated by a dental device if treatment is wanted. Please ask the patient to let us know if she needs a referral to sleep dentistry?  RV optional.

## 2021-08-16 ENCOUNTER — Ambulatory Visit: Payer: BC Managed Care – PPO | Admitting: Neurology
